# Patient Record
Sex: Female | Born: 1965 | Race: White | Hispanic: No | Marital: Married | Smoking: Never smoker
Health system: Southern US, Community
[De-identification: ages and names within clinical notes are randomized; demographics above are authoritative.]

## PROBLEM LIST (undated history)

## (undated) DIAGNOSIS — I10 Essential (primary) hypertension: Secondary | ICD-10-CM

## (undated) DIAGNOSIS — D219 Benign neoplasm of connective and other soft tissue, unspecified: Secondary | ICD-10-CM

## (undated) DIAGNOSIS — K862 Cyst of pancreas: Secondary | ICD-10-CM

## (undated) DIAGNOSIS — K219 Gastro-esophageal reflux disease without esophagitis: Secondary | ICD-10-CM

## (undated) DIAGNOSIS — R748 Abnormal levels of other serum enzymes: Secondary | ICD-10-CM

## (undated) DIAGNOSIS — K8020 Calculus of gallbladder without cholecystitis without obstruction: Secondary | ICD-10-CM

## (undated) DIAGNOSIS — N83209 Unspecified ovarian cyst, unspecified side: Secondary | ICD-10-CM

## (undated) HISTORY — DX: Essential (primary) hypertension: I10

## (undated) HISTORY — DX: Unspecified ovarian cyst, unspecified side: N83.209

## (undated) HISTORY — DX: Calculus of gallbladder without cholecystitis without obstruction: K80.20

## (undated) HISTORY — PX: CHOLECYSTECTOMY: SHX55

## (undated) HISTORY — PX: ABDOMINAL HYSTERECTOMY: SHX81

## (undated) HISTORY — PX: REFRACTIVE SURGERY: SHX103

## (undated) HISTORY — PX: APPENDECTOMY: SHX54

## (undated) HISTORY — PX: DILATION AND CURETTAGE OF UTERUS: SHX78

---

## 2009-07-31 ENCOUNTER — Ambulatory Visit: Payer: Self-pay | Admitting: Obstetrics and Gynecology

## 2010-09-04 ENCOUNTER — Ambulatory Visit: Payer: Self-pay | Admitting: Obstetrics and Gynecology

## 2010-09-05 ENCOUNTER — Ambulatory Visit: Payer: Self-pay | Admitting: Obstetrics and Gynecology

## 2011-09-30 ENCOUNTER — Ambulatory Visit: Payer: Self-pay | Admitting: Obstetrics and Gynecology

## 2012-08-13 ENCOUNTER — Ambulatory Visit: Payer: Self-pay | Admitting: Internal Medicine

## 2012-08-26 ENCOUNTER — Ambulatory Visit: Payer: Self-pay | Admitting: Otolaryngology

## 2012-10-05 ENCOUNTER — Ambulatory Visit: Payer: Self-pay | Admitting: Obstetrics and Gynecology

## 2012-10-06 ENCOUNTER — Ambulatory Visit: Payer: Self-pay | Admitting: Obstetrics and Gynecology

## 2013-05-17 DIAGNOSIS — D239 Other benign neoplasm of skin, unspecified: Secondary | ICD-10-CM

## 2013-05-17 HISTORY — DX: Other benign neoplasm of skin, unspecified: D23.9

## 2013-12-28 ENCOUNTER — Ambulatory Visit: Payer: Self-pay | Admitting: Physician Assistant

## 2014-03-25 ENCOUNTER — Ambulatory Visit (INDEPENDENT_AMBULATORY_CARE_PROVIDER_SITE_OTHER): Payer: BC Managed Care – PPO | Admitting: Physician Assistant

## 2014-03-25 VITALS — BP 152/90 | HR 89 | Temp 97.8°F | Resp 20 | Ht 64.0 in | Wt 187.4 lb

## 2014-03-25 DIAGNOSIS — N3001 Acute cystitis with hematuria: Secondary | ICD-10-CM

## 2014-03-25 DIAGNOSIS — R300 Dysuria: Secondary | ICD-10-CM

## 2014-03-25 DIAGNOSIS — N3000 Acute cystitis without hematuria: Secondary | ICD-10-CM

## 2014-03-25 DIAGNOSIS — R350 Frequency of micturition: Secondary | ICD-10-CM

## 2014-03-25 LAB — POCT UA - MICROSCOPIC ONLY
Bacteria, U Microscopic: NEGATIVE
Casts, Ur, LPF, POC: NEGATIVE
Crystals, Ur, HPF, POC: NEGATIVE
Epithelial cells, urine per micros: NEGATIVE
Mucus, UA: NEGATIVE
Yeast, UA: NEGATIVE

## 2014-03-25 LAB — POCT URINALYSIS DIPSTICK
Bilirubin, UA: NEGATIVE
Glucose, UA: NEGATIVE
Ketones, UA: NEGATIVE
Nitrite, UA: NEGATIVE
Protein, UA: 100
Spec Grav, UA: 1.02
Urobilinogen, UA: 0.2
pH, UA: 6

## 2014-03-25 MED ORDER — PHENAZOPYRIDINE HCL 200 MG PO TABS: 200.0000 mg | ORAL_TABLET | Freq: Three times a day (TID) | ORAL | Status: DC | PRN

## 2014-03-25 MED ORDER — CIPROFLOXACIN HCL 250 MG PO TABS: 250.0000 mg | ORAL_TABLET | Freq: Two times a day (BID) | ORAL | Status: DC

## 2014-03-25 NOTE — Progress Notes (Deleted)
   Subjective:    Patient ID: Denise [name], female    DOB: May 26, 1966, 48 y.o.   MRN: [id number]  HPI Dysuria and ugency since thurs.    Review of Systems     Objective:   Physical Exam        Assessment & Plan:

## 2014-03-25 NOTE — Progress Notes (Signed)
Patient ID: Denise [name] MRN: [id number], DOB: 02/04/1966, 48 y.o. Date of Encounter: 03/25/2014, 4:27 PM  Primary Physician: No PCP Per Patient  Chief Complaint: urinary frequency and dysuria  HPI: 48 y.o. year old female with presents with 3 day history of urinary frequency, dysuria, and suprapubic pressure. Denies flank pain, nausea, vomiting, fever, chills, vaginal discharge, or odor. Has tried to push fluids, no relief. LNMP 03/25/14. BP is elevated today - patient has PCP and Cardiologist. Reports her blood pressure "is always high." Patient is otherwise doing well without issues or complaints.  Past Medical History  Diagnosis Date  . Hypertension      Home Meds: Prior to Admission medications   Medication Sig Start Date End Date Taking? Authorizing Provider  cetirizine (ZYRTEC) 5 MG tablet Take 5 mg by mouth daily.   Yes Historical Provider, MD  Cholecalciferol 2000 UNITS TABS Take 1 tablet by mouth daily.   Yes Historical Provider, MD  ciprofloxacin (CIPRO) 250 MG tablet Take 1 tablet (250 mg total) by mouth 2 (two) times daily. 03/25/14   Lynsay Fesperman Elnora Morrison, PA-C  ibuprofen (ADVIL,MOTRIN) 200 MG tablet Take 800 mg by mouth daily.   Yes Historical Provider, MD  lisinopril (PRINIVIL,ZESTRIL) 20 MG tablet Take 20 mg by mouth daily.   Yes Historical Provider, MD  meloxicam (MOBIC) 7.5 MG tablet Take 7.5 mg by mouth daily.   Yes Historical Provider, MD  metoprolol succinate (TOPROL-XL) 25 MG 24 hr tablet Take 25 mg by mouth daily.   Yes Historical Provider, MD  Multiple Vitamins-Minerals (MULTIVITAMIN WITH MINERALS) tablet Take 1 tablet by mouth daily.   Yes Historical Provider, MD  Norethindrone-Ethinyl Estradiol-Fe (GENERESS FE) 0.8-25 MG-MCG tablet Chew 1 tablet by mouth daily.   Yes Historical Provider, MD  omeprazole (PRILOSEC) 40 MG capsule Take 40 mg by mouth daily.   Yes Historical Provider, MD  phenazopyridine (PYRIDIUM) 200 MG tablet Take 1 tablet (200 mg total) by mouth 3  (three) times daily as needed for pain. 03/25/14   Errica Dutil Elnora Morrison, PA-C  valACYclovir (VALTREX) 1000 MG tablet Take 1,000 mg by mouth 2 (two) times daily.   Yes Historical Provider, MD    Allergies:  Allergies  Allergen Reactions  . Sulfa Antibiotics Hives    History   Social History  . Marital Status: Married    Spouse Name: N/A    Number of Children: N/A  . Years of Education: N/A   Occupational History  . Not on file.   Social History Main Topics  . Smoking status: Never Smoker   . Smokeless tobacco: Never Used  . Alcohol Use: Yes     Comment: 1 drink/month  . Drug Use: No  . Sexual Activity: Not on file   Other Topics Concern  . Not on file   Social History Narrative  . No narrative on file     Review of Systems: Constitutional: negative for chills, fever, night sweats, weight changes, or fatigue  HEENT: negative for vision changes, hearing loss, congestion, rhinorrhea, ST, epistaxis, or sinus pressure Cardiovascular: negative for chest pain or palpitations Respiratory: negative for cough, hemoptysis, wheezing, shortness of breath. Abdominal: positive for suprapubic abdominal pain,   No nausea, vomiting, diarrhea, or constipation Genitourinary: positive for urinary frequency and dysuria. Negative for vaginal discharge, odor, or pelvic pain.  Dermatological: negative for rashes. Neurologic: negative for headache, dizziness, or syncope   Physical Exam: Blood pressure 152/90, pulse 89, temperature 97.8 F (36.6 C), temperature source Oral, resp.  rate 20, height 5\' 4"  (1.626 m), weight 187 lb 6 oz (84.993 kg), last menstrual period 03/25/2014, SpO2 98.00%., Body mass index is 32.15 kg/(m^2). General: Well developed, well nourished, in no acute distress. Head: Normocephalic, atraumatic, eyes without discharge, sclera non-icteric, nares are without discharge. External ear normal in appearance. Neck: Supple. No thyromegaly. Full ROM. No lymphadenopathy. Lungs: Clear  bilaterally to auscultation without wheezes, rales, or rhonchi. Breathing is unlabored. Heart: RRR with S1 S2. No murmurs, rubs, or gallops appreciated. Abdominal: +BS x 4. No hepatosplenomegaly, rebound tenderness, or guarding. Positive suprapubic tenderness. No CVA tenderness bilaterally.  Msk:  Strength and tone normal for age. Extremities/Skin: Warm and dry. No clubbing or cyanosis. No edema.  Neuro: Alert and oriented X 3. Moves all extremities spontaneously. Gait is normal. CNII-XII grossly in tact. Psych:  Responds to questions appropriately with a normal affect.   Labs: Results for orders placed in visit on 03/25/14  POCT URINALYSIS DIPSTICK      Result Value Ref Range   Color, UA yellow     Clarity, UA cloudy     Glucose, UA neg     Bilirubin, UA neg     Ketones, UA neg     Spec Grav, UA 1.020     Blood, UA large     pH, UA 6.0     Protein, UA 100     Urobilinogen, UA 0.2     Nitrite, UA neg     Leukocytes, UA moderate (2+)    POCT UA - MICROSCOPIC ONLY      Result Value Ref Range   WBC, Ur, HPF, POC tntc     RBC, urine, microscopic tntc     Bacteria, U Microscopic neg     Mucus, UA neg     Epithelial cells, urine per micros neg     Crystals, Ur, HPF, POC neg     Casts, Ur, LPF, POC neg     Yeast, UA neg        ASSESSMENT AND PLAN:  48 y.o. year old female with urinary tract infection Urine culture sent Increase fluids Cipro 250 mg bid x 5 days Pyridium 200 mg tid prn pain Follow up if symptoms worsen or fail to improve.  F/u with PCP as planned for elevated BP  Signed, Georgiann Mccoy, PA-C 03/25/2014 4:27 PM

## 2014-03-29 LAB — URINE CULTURE

## 2014-03-30 ENCOUNTER — Telehealth: Payer: Self-pay

## 2014-03-30 NOTE — Telephone Encounter (Signed)
Advised pt to RTC- she is not feeling 100% yet. She will try to come in tomorrow.

## 2014-03-30 NOTE — Telephone Encounter (Signed)
Advised pt to RTC.  She states she only got 3 days of the antibiotic she usually received at least 7 days for a UTI. Please advise if we can send additional days to her pharmacy.

## 2014-03-30 NOTE — Telephone Encounter (Signed)
Cipro is prescribed for 3 days. If she is feeling better, no further tx needed.

## 2014-03-30 NOTE — Telephone Encounter (Signed)
[facility] [place]   Needs more meds for UTI.   [phone]

## 2014-10-06 ENCOUNTER — Other Ambulatory Visit: Payer: Self-pay | Admitting: Obstetrics and Gynecology

## 2014-10-06 DIAGNOSIS — R928 Other abnormal and inconclusive findings on diagnostic imaging of breast: Secondary | ICD-10-CM

## 2014-10-16 ENCOUNTER — Ambulatory Visit
Admission: RE | Admit: 2014-10-16 | Discharge: 2014-10-16 | Disposition: A | Discharge status: Home / Self Care | Payer: BLUE CROSS/BLUE SHIELD | Source: Ambulatory Visit | Attending: Obstetrics and Gynecology | Admitting: Obstetrics and Gynecology

## 2014-10-16 ENCOUNTER — Other Ambulatory Visit: Payer: Self-pay | Admitting: Obstetrics and Gynecology

## 2014-10-16 DIAGNOSIS — R928 Other abnormal and inconclusive findings on diagnostic imaging of breast: Secondary | ICD-10-CM

## 2014-12-06 ENCOUNTER — Inpatient Hospital Stay (HOSPITAL_COMMUNITY)
Admission: AD | Admit: 2014-12-06 | Discharge: 2014-12-07 | Disposition: A | Discharge status: Home / Self Care | Payer: BLUE CROSS/BLUE SHIELD | Source: Ambulatory Visit | Attending: Obstetrics and Gynecology | Admitting: Obstetrics and Gynecology

## 2014-12-06 DIAGNOSIS — D259 Leiomyoma of uterus, unspecified: Secondary | ICD-10-CM | POA: Insufficient documentation

## 2014-12-06 DIAGNOSIS — N921 Excessive and frequent menstruation with irregular cycle: Secondary | ICD-10-CM

## 2014-12-06 DIAGNOSIS — I10 Essential (primary) hypertension: Secondary | ICD-10-CM | POA: Insufficient documentation

## 2014-12-06 DIAGNOSIS — N920 Excessive and frequent menstruation with regular cycle: Secondary | ICD-10-CM | POA: Insufficient documentation

## 2014-12-06 HISTORY — DX: Benign neoplasm of connective and other soft tissue, unspecified: D21.9

## 2014-12-06 NOTE — MAU Note (Signed)
Pt reports known fibroids, had Depo on 02/05. States she started spotting on 03/10 and spotted for 12 days, followed by a normal period that lasted until 04/14. States she then started having heavy bleeding and today she has been changing her pad q 15 min for the last 4 hours. Passing large clots.

## 2014-12-07 ENCOUNTER — Encounter (HOSPITAL_COMMUNITY): Payer: Self-pay

## 2014-12-07 DIAGNOSIS — I10 Essential (primary) hypertension: Secondary | ICD-10-CM | POA: Diagnosis not present

## 2014-12-07 DIAGNOSIS — N921 Excessive and frequent menstruation with irregular cycle: Secondary | ICD-10-CM

## 2014-12-07 DIAGNOSIS — D259 Leiomyoma of uterus, unspecified: Secondary | ICD-10-CM | POA: Diagnosis not present

## 2014-12-07 DIAGNOSIS — N920 Excessive and frequent menstruation with regular cycle: Secondary | ICD-10-CM | POA: Diagnosis not present

## 2014-12-07 LAB — CBC
HCT: 36.8 % (ref 36.0–46.0)
Hemoglobin: 11.9 g/dL — ABNORMAL LOW (ref 12.0–15.0)
MCH: 27.4 pg (ref 26.0–34.0)
MCHC: 32.3 g/dL (ref 30.0–36.0)
MCV: 84.8 fL (ref 78.0–100.0)
Platelets: 338 10*3/uL (ref 150–400)
RBC: 4.34 MIL/uL (ref 3.87–5.11)
RDW: 14.2 % (ref 11.5–15.5)
WBC: 13.9 10*3/uL — AB (ref 4.0–10.5)

## 2014-12-07 LAB — URINE MICROSCOPIC-ADD ON

## 2014-12-07 LAB — POCT PREGNANCY, URINE
PREG TEST UR: NEGATIVE
Preg Test, Ur: NEGATIVE

## 2014-12-07 LAB — URINALYSIS, ROUTINE W REFLEX MICROSCOPIC
Bilirubin Urine: NEGATIVE
GLUCOSE, UA: NEGATIVE mg/dL
Ketones, ur: NEGATIVE mg/dL
Leukocytes, UA: NEGATIVE
Nitrite: NEGATIVE
Protein, ur: NEGATIVE mg/dL
Specific Gravity, Urine: 1.025 (ref 1.005–1.030)
Urobilinogen, UA: 0.2 mg/dL (ref 0.0–1.0)
pH: 5.5 (ref 5.0–8.0)

## 2014-12-07 NOTE — MAU Provider Note (Signed)
History     CSN: [id number]  Arrival date and time: 12/06/14 2345   First Provider Initiated Contact with Patient 12/07/14 0038      Chief Complaint  Patient presents with  . Menorrhagia   HPI Comments: Denise [name] is a 49 y.o. [phone] who presents today with vaginal bleeding. She had depo on 2/5 because she had been bleeding for 45 days. She states that on March 10th she had 12 days of spotting until 3/22. Then on 3/22 she started to have menstrual like bleeding. She has been bleeding since then, but it was mostly light. However, in the last week it has gotten heavier, and she has been passing grape to apple sized clots today. She has been changing her pad every 15 mins. She called the office and, states that she was given an RX for megace QD x 7 days, and she has an appointment with Dr. Helane Rima on Tuesday.   Vaginal Bleeding The patient's primary symptoms include vaginal bleeding. The patient's pertinent negatives include no pelvic pain. This is a new problem. The current episode started more than 1 month ago. The problem has been waxing and waning. The pain is mild. She is not pregnant. Pertinent negatives include no abdominal pain, constipation, diarrhea (looser stools), dysuria, fever, frequency, nausea, urgency or vomiting. The vaginal bleeding is heavier than menses. She has been passing clots. She has not been passing tissue. Nothing aggravates the symptoms. Treatments tried: When she is laying down sleeping it has been less.  She is sexually active. No, her partner does not have an STD.   Past Medical History  Diagnosis Date  . Hypertension   . Fibroid     currently and previously    Past Surgical History  Procedure Laterality Date  . Appendectomy    . Cholecystectomy    . Cesarean section    . Eye surgery      Family History  Problem Relation Age of Onset  . Hyperlipidemia Mother   . Hypertension Mother   . Hyperlipidemia Brother   . Hypertension Brother      History  Substance Use Topics  . Smoking status: Never Smoker   . Smokeless tobacco: Never Used  . Alcohol Use: Yes     Comment: 1 drink/month    Allergies:  Allergies  Allergen Reactions  . Sulfa Antibiotics Hives    Prescriptions prior to admission  Medication Sig Dispense Refill Last Dose  . cetirizine (ZYRTEC) 5 MG tablet Take 5 mg by mouth daily.   12/06/2014 at Unknown time  . Cholecalciferol 2000 UNITS TABS Take 1 tablet by mouth daily.   12/06/2014 at Unknown time  . lisinopril (PRINIVIL,ZESTRIL) 20 MG tablet Take 20 mg by mouth daily.   12/06/2014 at Unknown time  . megestrol (MEGACE) 20 MG tablet Take 20 mg by mouth daily.   12/06/2014 at Unknown time  . metoprolol succinate (TOPROL-XL) 25 MG 24 hr tablet Take 25 mg by mouth daily.   12/06/2014 at Unknown time  . Multiple Vitamins-Minerals (MULTIVITAMIN WITH MINERALS) tablet Take 1 tablet by mouth daily.   12/06/2014 at Unknown time  . omeprazole (PRILOSEC) 40 MG capsule Take 40 mg by mouth daily.   12/06/2014 at Unknown time  . ciprofloxacin (CIPRO) 250 MG tablet Take 1 tablet (250 mg total) by mouth 2 (two) times daily. 6 tablet 0   . ibuprofen (ADVIL,MOTRIN) 200 MG tablet Take 800 mg by mouth daily.   Taking  . meloxicam (MOBIC) 7.5  MG tablet Take 7.5 mg by mouth daily.   Taking  . Norethindrone-Ethinyl Estradiol-Fe (GENERESS FE) 0.8-25 MG-MCG tablet Chew 1 tablet by mouth daily.   Taking  . phenazopyridine (PYRIDIUM) 200 MG tablet Take 1 tablet (200 mg total) by mouth 3 (three) times daily as needed for pain. 10 tablet 0   . valACYclovir (VALTREX) 1000 MG tablet Take 1,000 mg by mouth 2 (two) times daily.   Taking    Review of Systems  Constitutional: Negative for fever.       Swelling in fingers   Gastrointestinal: Negative for nausea, vomiting, abdominal pain, diarrhea (looser stools) and constipation.  Genitourinary: Positive for vaginal bleeding. Negative for dysuria, urgency, frequency and pelvic pain.    Physical Exam   Blood pressure 158/92, pulse 96, temperature 99.2 F (37.3 C), temperature source Oral, resp. rate 20, height 5' 4.5" (1.638 m), weight 89.812 kg (198 lb).  Physical Exam  Nursing note and vitals reviewed. Constitutional: She is oriented to person, place, and time. She appears well-developed and well-nourished. No distress.  Cardiovascular: Normal rate.   Respiratory: Effort normal.  GI: Soft. There is no tenderness. There is no rebound.  Genitourinary:   External: no lesion Vagina: small amount of blood seen  Cervix: pink, smooth, no CMT, small clot at the os.  Uterus: about 12 weeks size  Adnexa: NT   Neurological: She is alert and oriented to person, place, and time.  Skin: Skin is warm and dry.  Psychiatric: She has a normal mood and affect.   Results for orders placed or performed during the hospital encounter of 12/06/14 (from the past 24 hour(s))  CBC     Status: Abnormal   Collection Time: 12/07/14 12:36 AM  Result Value Ref Range   WBC 13.9 (H) 4.0 - 10.5 K/uL   RBC 4.34 3.87 - 5.11 MIL/uL   Hemoglobin 11.9 (L) 12.0 - 15.0 g/dL   HCT 36.8 36.0 - 46.0 %   MCV 84.8 78.0 - 100.0 fL   MCH 27.4 26.0 - 34.0 pg   MCHC 32.3 30.0 - 36.0 g/dL   RDW 14.2 11.5 - 15.5 %   Platelets 338 150 - 400 K/uL    MAU Course  Procedures  MDM 0140: D/W Dr. Matthew Saras. Ok for DC home at this time. She can call the office in the morning, and they will work her in for an appointment.   Assessment and Plan   1. Menometrorrhagia    DC home Continue to megace as currently prescribed FU with the office tomorrow  Return to MAU as needed  Follow-up Information    Follow up with GREWAL,MICHELLE L, MD In 1 day.   Specialty:  Obstetrics and Gynecology   Why:  Call the office in the morning. Let them know you were seen here overnight, and Dr. Matthew Saras wanted you to be seen in the office on 12/07/14   Contact information:   [place]  [phone]        Mathis Bud 12/07/2014, 12:39 AM

## 2014-12-07 NOTE — MAU Note (Signed)
POC urine pregnancy test done. Results negative, but not flowing from meter to the chart. Entered manually.

## 2014-12-07 NOTE — Discharge Instructions (Signed)

## 2015-01-16 ENCOUNTER — Encounter (HOSPITAL_COMMUNITY)
Admission: RE | Admit: 2015-01-16 | Discharge: 2015-01-16 | Disposition: A | Discharge status: Home / Self Care | Payer: BLUE CROSS/BLUE SHIELD | Source: Ambulatory Visit | Attending: Obstetrics and Gynecology | Admitting: Obstetrics and Gynecology

## 2015-01-16 ENCOUNTER — Encounter (HOSPITAL_COMMUNITY): Payer: Self-pay

## 2015-01-16 ENCOUNTER — Other Ambulatory Visit: Payer: Self-pay

## 2015-01-16 DIAGNOSIS — N920 Excessive and frequent menstruation with regular cycle: Secondary | ICD-10-CM | POA: Diagnosis not present

## 2015-01-16 DIAGNOSIS — Z01818 Encounter for other preprocedural examination: Secondary | ICD-10-CM | POA: Insufficient documentation

## 2015-01-16 HISTORY — DX: Gastro-esophageal reflux disease without esophagitis: K21.9

## 2015-01-16 LAB — CBC
HEMATOCRIT: 37.6 % (ref 36.0–46.0)
HEMOGLOBIN: 11.8 g/dL — AB (ref 12.0–15.0)
MCH: 25.8 pg — AB (ref 26.0–34.0)
MCHC: 31.4 g/dL (ref 30.0–36.0)
MCV: 82.1 fL (ref 78.0–100.0)
Platelets: 398 10*3/uL (ref 150–400)
RBC: 4.58 MIL/uL (ref 3.87–5.11)
RDW: 14.4 % (ref 11.5–15.5)
WBC: 10.8 10*3/uL — ABNORMAL HIGH (ref 4.0–10.5)

## 2015-01-16 NOTE — Patient Instructions (Signed)
Your procedure is scheduled on: June 16,2016   Enter through the Main Entrance of [facility] at: 12:00  Pick up the phone at the desk and dial [phone].  Call this number if you have problems the morning of surgery: [phone].  Remember: Do NOT eat food: after midnight on June 15,2016 Do NOT drink clear liquids after: 9:30 am day of surgery Take these medicines the morning of surgery with a SIP OF WATER:  Toprol-XL, prilosec, Lisinopril   Do NOT wear jewelry (body piercing), metal hair clips/bobby pins, make-up, or nail polish. Do NOT wear lotions, powders, or perfumes.  You may wear deoderant. Do NOT shave for 48 hours prior to surgery. Do NOT bring valuables to the hospital. Contacts, dentures, or bridgework may not be worn into surgery. Have a responsible adult drive you home and stay with you for 24 hours after your procedure

## 2015-01-30 NOTE — H&P (Signed)
49 year old G 3 P 1 with known uterine fibroids presents for HTA. She has large fibroids and one has a large submucosal component.  She has been having heavy bleeding despite OCPS and Depo Provera and Lysteda. She has been counseled on hysterectomy and wants to have a hysterectomy but today wants a HTA to decrease the bleeding because of her summer travel plans. She is aware that the HTA may not completely resolve her problem. She has also been informed that the fibroids are too large to resect with the hysteroscope.  Past Medical History  Diagnosis Date  . Hypertension   . Fibroid     currently and previously  . GERD (gastroesophageal reflux disease)     takes prilosec    Past Surgical History  Procedure Laterality Date  . Appendectomy    . Cholecystectomy    . Cesarean section    . Eye surgery    . Dilation and curettage of uterus      missed AB x2   . Family History  Problem Relation Age of Onset  . Hyperlipidemia Mother   . Hypertension Mother   . Hyperlipidemia Brother   . Hypertension Brother    History   Social History  . Marital Status: Married    Spouse Name: N/A  . Number of Children: N/A  . Years of Education: N/A   Occupational History  . Not on file.   Social History Main Topics  . Smoking status: Never Smoker   . Smokeless tobacco: Never Used  . Alcohol Use: Yes     Comment: 1 drink/month  . Drug Use: No  . Sexual Activity: Not on file   Other Topics Concern  . Not on file   Social History Narrative   Allergies:  Latex and Sulfa antibiotics  Prior to Admission medications   Medication Sig Start Date End Date Taking? Authorizing Provider  cetirizine (ZYRTEC) 10 MG tablet Take 10 mg by mouth daily.   Yes Historical Provider, MD  Cholecalciferol 2000 UNITS TABS Take 1 tablet by mouth daily.   Yes Historical Provider, MD  cyclobenzaprine (FLEXERIL) 10 MG tablet Take 10 mg by mouth 3 (three) times daily as needed for muscle spasms (cramps).   Yes  Historical Provider, MD  lisinopril (PRINIVIL,ZESTRIL) 20 MG tablet Take 20 mg by mouth daily.   Yes Historical Provider, MD  medroxyPROGESTERone (DEPO-PROVERA) 150 MG/ML injection Inject 150 mg into the muscle every 3 (three) months.   Yes Historical Provider, MD  megestrol (MEGACE) 20 MG tablet Take 20 mg by mouth daily.   Yes Historical Provider, MD  metoprolol succinate (TOPROL-XL) 25 MG 24 hr tablet Take 25 mg by mouth daily.   Yes Historical Provider, MD  Multiple Vitamins-Minerals (MULTIVITAMIN WITH MINERALS) tablet Take 1 tablet by mouth daily.   Yes Historical Provider, MD  naproxen sodium (ANAPROX) 220 MG tablet Take 220 mg by mouth daily as needed (headache).   Yes Historical Provider, MD  omeprazole (PRILOSEC) 40 MG capsule Take 40 mg by mouth daily.   Yes Historical Provider, MD  valACYclovir (VALTREX) 1000 MG tablet Take 1,000 mg by mouth 2 (two) times daily as needed (for fever blisters).   Yes Historical Provider, MD   There were no vitals taken for this visit. No results found for this or any previous visit (from the past 24 hour(s)). General alert and oriented Lung CTAB Car RRR Abdomen is soft and non tender Pelvic fibroid uterus  IMPRESSION: Menorrhagia Fibroids  PLAN:  HTA Risks reviewed Consent signed.

## 2015-02-01 ENCOUNTER — Encounter (HOSPITAL_COMMUNITY)
Admission: RE | Disposition: A | Discharge status: Home / Self Care | Payer: Self-pay | Source: Ambulatory Visit | Attending: Obstetrics and Gynecology

## 2015-02-01 ENCOUNTER — Ambulatory Visit (HOSPITAL_COMMUNITY): Payer: BLUE CROSS/BLUE SHIELD | Admitting: Anesthesiology

## 2015-02-01 ENCOUNTER — Ambulatory Visit (HOSPITAL_COMMUNITY)
Admission: RE | Admit: 2015-02-01 | Discharge: 2015-02-01 | Disposition: A | Discharge status: Home / Self Care | Payer: BLUE CROSS/BLUE SHIELD | Source: Ambulatory Visit | Attending: Obstetrics and Gynecology | Admitting: Obstetrics and Gynecology

## 2015-02-01 DIAGNOSIS — Z79899 Other long term (current) drug therapy: Secondary | ICD-10-CM | POA: Insufficient documentation

## 2015-02-01 DIAGNOSIS — D250 Submucous leiomyoma of uterus: Secondary | ICD-10-CM | POA: Diagnosis not present

## 2015-02-01 DIAGNOSIS — N938 Other specified abnormal uterine and vaginal bleeding: Secondary | ICD-10-CM | POA: Diagnosis present

## 2015-02-01 DIAGNOSIS — I10 Essential (primary) hypertension: Secondary | ICD-10-CM | POA: Diagnosis not present

## 2015-02-01 DIAGNOSIS — Z793 Long term (current) use of hormonal contraceptives: Secondary | ICD-10-CM | POA: Diagnosis not present

## 2015-02-01 DIAGNOSIS — K219 Gastro-esophageal reflux disease without esophagitis: Secondary | ICD-10-CM | POA: Diagnosis not present

## 2015-02-01 DIAGNOSIS — Z791 Long term (current) use of non-steroidal anti-inflammatories (NSAID): Secondary | ICD-10-CM | POA: Diagnosis not present

## 2015-02-01 HISTORY — PX: DILITATION & CURRETTAGE/HYSTROSCOPY WITH HYDROTHERMAL ABLATION: SHX5570

## 2015-02-01 LAB — BASIC METABOLIC PANEL
Anion gap: 6 (ref 5–15)
BUN: 12 mg/dL (ref 6–20)
CALCIUM: 9.2 mg/dL (ref 8.9–10.3)
CO2: 25 mmol/L (ref 22–32)
CREATININE: 0.74 mg/dL (ref 0.44–1.00)
Chloride: 108 mmol/L (ref 101–111)
GFR calc Af Amer: >60 mL/min (ref >60)
GFR calc non Af Amer: >60 mL/min (ref >60)
Glucose, Bld: 98 mg/dL (ref 65–99)
Potassium: 4.3 mmol/L (ref 3.5–5.1)
Sodium: 139 mmol/L (ref 135–145)

## 2015-02-01 LAB — TYPE AND SCREEN
ABO/RH(D): AB NEG
ANTIBODY SCREEN: NEGATIVE

## 2015-02-01 LAB — ABO/RH: ABO/RH(D): AB NEG

## 2015-02-01 LAB — PREGNANCY, URINE: PREG TEST UR: NEGATIVE

## 2015-02-01 SURGERY — DILATATION & CURETTAGE/HYSTEROSCOPY WITH HYDROTHERMAL ABLATION
Anesthesia: General

## 2015-02-01 MED ORDER — CEFAZOLIN SODIUM-DEXTROSE 2-3 GM-% IV SOLR
INTRAVENOUS | Status: DC
Start: 2015-02-01 — End: 2015-02-01
  Filled 2015-02-01: qty 50

## 2015-02-01 MED ORDER — LIDOCAINE HCL (CARDIAC) 20 MG/ML IV SOLN
INTRAVENOUS | Status: DC | PRN
  Administered 2015-02-01: 60 mg via INTRAVENOUS

## 2015-02-01 MED ORDER — MIDAZOLAM HCL 2 MG/2ML IJ SOLN
INTRAMUSCULAR | Status: AC
  Filled 2015-02-01: qty 2

## 2015-02-01 MED ORDER — LACTATED RINGERS IV SOLN: INTRAVENOUS | Status: DC

## 2015-02-01 MED ORDER — PROPOFOL 10 MG/ML IV BOLUS
INTRAVENOUS | Status: AC
  Filled 2015-02-01: qty 20

## 2015-02-01 MED ORDER — DEXAMETHASONE SODIUM PHOSPHATE 10 MG/ML IJ SOLN
INTRAMUSCULAR | Status: DC | PRN
  Administered 2015-02-01: 4 mg via INTRAVENOUS

## 2015-02-01 MED ORDER — ACETAMINOPHEN 160 MG/5ML PO SOLN
ORAL | Status: AC
  Administered 2015-02-01: 975 mg via ORAL
  Filled 2015-02-01: qty 40.6

## 2015-02-01 MED ORDER — OXYCODONE-ACETAMINOPHEN 5-325 MG PO TABS
1.0000 | ORAL_TABLET | ORAL | Status: DC | PRN
  Administered 2015-02-01: 1 via ORAL

## 2015-02-01 MED ORDER — KETOROLAC TROMETHAMINE 30 MG/ML IJ SOLN
INTRAMUSCULAR | Status: DC | PRN
  Administered 2015-02-01: 30 mg via INTRAVENOUS

## 2015-02-01 MED ORDER — MIDAZOLAM HCL 2 MG/2ML IJ SOLN
INTRAMUSCULAR | Status: DC | PRN
  Administered 2015-02-01: 2 mg via INTRAVENOUS

## 2015-02-01 MED ORDER — FENTANYL CITRATE (PF) 100 MCG/2ML IJ SOLN
INTRAMUSCULAR | Status: AC
  Filled 2015-02-01: qty 2

## 2015-02-01 MED ORDER — LIDOCAINE HCL 1 % IJ SOLN
INTRAMUSCULAR | Status: DC | PRN
  Administered 2015-02-01: 10 mL

## 2015-02-01 MED ORDER — CEFAZOLIN SODIUM-DEXTROSE 2-3 GM-% IV SOLR
2.0000 g | INTRAVENOUS | Status: AC
  Administered 2015-02-01: 2 g via INTRAVENOUS

## 2015-02-01 MED ORDER — PHENYLEPHRINE HCL 10 MG/ML IJ SOLN
INTRAMUSCULAR | Status: DC | PRN
  Administered 2015-02-01: 80 ug via INTRAVENOUS

## 2015-02-01 MED ORDER — OXYCODONE-ACETAMINOPHEN 10-325 MG PO TABS: 1.0000 | ORAL_TABLET | ORAL | Status: DC | PRN

## 2015-02-01 MED ORDER — DEXAMETHASONE SODIUM PHOSPHATE 4 MG/ML IJ SOLN
INTRAMUSCULAR | Status: AC
  Filled 2015-02-01: qty 1

## 2015-02-01 MED ORDER — FENTANYL CITRATE (PF) 100 MCG/2ML IJ SOLN
25.0000 ug | INTRAMUSCULAR | Status: DC | PRN
  Administered 2015-02-01: 50 ug via INTRAVENOUS

## 2015-02-01 MED ORDER — SCOPOLAMINE 1 MG/3DAYS TD PT72
1.0000 | MEDICATED_PATCH | Freq: Once | TRANSDERMAL | Status: DC
  Administered 2015-02-01: 1.5 mg via TRANSDERMAL

## 2015-02-01 MED ORDER — FENTANYL CITRATE (PF) 100 MCG/2ML IJ SOLN
INTRAMUSCULAR | Status: DC | PRN
  Administered 2015-02-01 (×4): 50 ug via INTRAVENOUS

## 2015-02-01 MED ORDER — SCOPOLAMINE 1 MG/3DAYS TD PT72
MEDICATED_PATCH | TRANSDERMAL | Status: DC
Start: 2015-02-01 — End: 2015-02-01
  Administered 2015-02-01: 1.5 mg via TRANSDERMAL
  Filled 2015-02-01: qty 1

## 2015-02-01 MED ORDER — KETOROLAC TROMETHAMINE 30 MG/ML IJ SOLN
INTRAMUSCULAR | Status: AC
  Filled 2015-02-01: qty 1

## 2015-02-01 MED ORDER — ONDANSETRON HCL 4 MG/2ML IJ SOLN
INTRAMUSCULAR | Status: DC | PRN
  Administered 2015-02-01: 4 mg via INTRAVENOUS

## 2015-02-01 MED ORDER — PROPOFOL 10 MG/ML IV BOLUS
INTRAVENOUS | Status: DC | PRN
  Administered 2015-02-01: 200 mg via INTRAVENOUS

## 2015-02-01 MED ORDER — LIDOCAINE HCL 1 % IJ SOLN
INTRAMUSCULAR | Status: AC
  Filled 2015-02-01: qty 20

## 2015-02-01 MED ORDER — ONDANSETRON HCL 4 MG/2ML IJ SOLN
INTRAMUSCULAR | Status: AC
  Filled 2015-02-01: qty 2

## 2015-02-01 MED ORDER — LIDOCAINE HCL (CARDIAC) 20 MG/ML IV SOLN
INTRAVENOUS | Status: AC
  Filled 2015-02-01: qty 5

## 2015-02-01 MED ORDER — OXYCODONE-ACETAMINOPHEN 5-325 MG PO TABS
ORAL_TABLET | ORAL | Status: AC
  Filled 2015-02-01: qty 1

## 2015-02-01 MED ORDER — ACETAMINOPHEN 160 MG/5ML PO SOLN
975.0000 mg | Freq: Once | ORAL | Status: AC
  Administered 2015-02-01: 975 mg via ORAL

## 2015-02-01 MED ORDER — LACTATED RINGERS IV SOLN
INTRAVENOUS | Status: DC
  Administered 2015-02-01: 13:00:00 via INTRAVENOUS

## 2015-02-01 SURGICAL SUPPLY — 16 items
CANISTER SUCT 3000ML (MISCELLANEOUS) ×2 IMPLANT
CATH ROBINSON RED A/P 16FR (CATHETERS) ×2 IMPLANT
CLOTH BEACON ORANGE TIMEOUT ST (SAFETY) ×2 IMPLANT
CONTAINER PREFILL 10% NBF 60ML (FORM) ×4 IMPLANT
ELECT REM PT RETURN 9FT ADLT (ELECTROSURGICAL) ×2
ELECTRODE REM PT RTRN 9FT ADLT (ELECTROSURGICAL) ×1 IMPLANT
GLOVE BIO SURGEON STRL SZ 6.5 (GLOVE) ×2 IMPLANT
GOWN STRL REUS W/TWL LRG LVL3 (GOWN DISPOSABLE) ×4 IMPLANT
LOOP ANGLED CUTTING 22FR (CUTTING LOOP) IMPLANT
PACK VAGINAL MINOR WOMEN LF (CUSTOM PROCEDURE TRAY) ×2 IMPLANT
PAD OB MATERNITY 4.3X12.25 (PERSONAL CARE ITEMS) ×2 IMPLANT
SCOPETTES 8  STERILE (MISCELLANEOUS)
SCOPETTES 8 STERILE (MISCELLANEOUS) IMPLANT
SET GENESYS HTA PROCERVA (MISCELLANEOUS) ×2 IMPLANT
TOWEL OR 17X24 6PK STRL BLUE (TOWEL DISPOSABLE) ×4 IMPLANT
WATER STERILE IRR 1000ML POUR (IV SOLUTION) ×2 IMPLANT

## 2015-02-01 NOTE — Anesthesia Preprocedure Evaluation (Signed)
Anesthesia Evaluation  Patient identified by MRN, date of birth, ID band Patient awake    Reviewed: Allergy & Precautions, H&P , Patient's Chart, lab work & pertinent test results, reviewed documented beta blocker date and time   Airway Mallampati: II  TM Distance: >3 FB Neck ROM: full    Dental no notable dental hx.    Pulmonary  breath sounds clear to auscultation  Pulmonary exam normal       Cardiovascular hypertension, On Medications Rhythm:regular Rate:Normal     Neuro/Psych    GI/Hepatic GERD-  Medicated,  Endo/Other    Renal/GU      Musculoskeletal   Abdominal   Peds  Hematology   Anesthesia Other Findings Hypertension Fibroid   GERD (gastroesophageal reflux         Reproductive/Obstetrics                             Anesthesia Physical Anesthesia Plan  ASA: II  Anesthesia Plan:    Post-op Pain Management:    Induction: Intravenous  Airway Management Planned: LMA  Additional Equipment:   Intra-op Plan:   Post-operative Plan:   Informed Consent: I have reviewed the patients History and Physical, chart, labs and discussed the procedure including the risks, benefits and alternatives for the proposed anesthesia with the patient or authorized representative who has indicated his/her understanding and acceptance.   Dental Advisory Given and Dental advisory given  Plan Discussed with: CRNA and Surgeon  Anesthesia Plan Comments: (Discussed GA with LMA, possible sore throat, potential need to switch to ETT, N/V, pulmonary aspiration. Questions answered. )        Anesthesia Quick Evaluation

## 2015-02-01 NOTE — Discharge Instructions (Signed)
DISCHARGE INSTRUCTIONS: D&C The following instructions have been prepared to help you care for yourself upon your return home.  REMOVE THE PATCH BEHIND YOUR EAR ON 02/03/15.  [place] YOUR HANDS THOROUGHLY AFTER REMOVAL!!! MAY TAKE IBUPROFEN (MOTRIN, ADVIL) OR ALEVE AFTER 8:00 PM!!!   Personal hygiene:  Use sanitary pads for vaginal drainage, not tampons.  Shower the day after your procedure.  NO tub baths, pools or Jacuzzis for 2-3 weeks.  Wipe front to back after using the bathroom.  Activity and limitations:  Do NOT drive or operate any equipment for 24 hours. The effects of anesthesia are still present and drowsiness may result.  Do NOT rest in bed all day.  Walking is encouraged.  Walk up and down stairs slowly.  You may resume your normal activity in one to two days or as indicated by your physician.  Sexual activity: NO intercourse for at least 2 weeks after the procedure, or as indicated by your physician.  Diet: Eat a light meal as desired this evening. You may resume your usual diet tomorrow.  Return to work: You may resume your work activities in one to two days or as indicated by your doctor.  What to expect after your surgery: Expect to have vaginal bleeding/discharge for 2-3 days and spotting for up to 10 days. It is not unusual to have soreness for up to 1-2 weeks. You may have a slight burning sensation when you urinate for the first day. Mild cramps may continue for a couple of days. You may have a regular period in 2-6 weeks.  Call your doctor for any of the following:  Excessive vaginal bleeding, saturating and changing one pad every hour.  Inability to urinate 6 hours after discharge from hospital.  Pain not relieved by pain medication.  Fever of 100.4 F or greater.  Unusual vaginal discharge or odor.   Call for an appointment:    Patients signature: ______________________  Nurses signature ________________________  Support person's  signature_______________________

## 2015-02-01 NOTE — Progress Notes (Signed)
H and P on the chart No significant changes Will proceed with D and C, HYS, HTA Consent signed

## 2015-02-01 NOTE — Brief Op Note (Signed)
02/01/2015  1:58 PM  PATIENT:  Denise [name]  48 y.o. female  PRE-OPERATIVE DIAGNOSIS:  MENORRHAGIA  POST-OPERATIVE DIAGNOSIS:  MENORRHAGIA, Fibroids  PROCEDURE:  Procedure(s): HYSTEROSCOPY WITH HYDROTHERMAL ABLATION (N/A)  SURGEON:  Surgeon(s) and Role:    * Dian Queen, MD - Primary  PHYSICIAN ASSISTANT:   ASSISTANTS: none   ANESTHESIA:   paracervical block and MAC  EBL:  Total I/O In: -  Out: 50 [Urine:50]  BLOOD ADMINISTERED:none  DRAINS: none   LOCAL MEDICATIONS USED:  LIDOCAINE   SPECIMEN:  No Specimen  DISPOSITION OF SPECIMEN:  N/A  COUNTS:  YES  TOURNIQUET:  * No tourniquets in log *  DICTATION: .Other Dictation: Dictation Number [id number]  PLAN OF CARE: discharge home  PATIENT DISPOSITION:  PACU - hemodynamically stable.   Delay start of Pharmacological VTE agent (>24hrs) due to surgical blood loss or risk of bleeding: yes

## 2015-02-01 NOTE — Transfer of Care (Signed)
Immediate Anesthesia Transfer of Care Note  Patient: Denise [name]  Procedure(s) Performed: Procedure(s): HYSTEROSCOPY WITH HYDROTHERMAL ABLATION (N/A)  Patient Location: PACU  Anesthesia Type:General  Level of Consciousness: awake, alert  and oriented  Airway & Oxygen Therapy: Patient Spontanous Breathing and Patient connected to nasal cannula oxygen  Post-op Assessment: Report given to RN and Post -op Vital signs reviewed and stable  Post vital signs: Reviewed and stable  Last Vitals:  Filed Vitals:   02/01/15 1204  BP: 133/89  Pulse: 98  Temp: 36.7 C  Resp: 18    Complications: No apparent anesthesia complications

## 2015-02-01 NOTE — Anesthesia Procedure Notes (Signed)
Procedure Name: LMA Insertion Date/Time: 02/01/2015 1:17 PM Performed by: Jonna Munro Pre-anesthesia Checklist: Patient identified, Emergency Drugs available, Suction available, Timeout performed and Patient being monitored Patient Re-evaluated:Patient Re-evaluated prior to inductionOxygen Delivery Method: Circle system utilized Preoxygenation: Pre-oxygenation with 100% oxygen Intubation Type: IV induction LMA: LMA inserted LMA Size: 4.0 Number of attempts: 1 Placement Confirmation: positive ETCO2 and breath sounds checked- equal and bilateral Tube secured with: Tape Dental Injury: Teeth and Oropharynx as per pre-operative assessment

## 2015-02-02 NOTE — Op Note (Signed)
[name], [name]                ACCOUNT NO.:  [id number]  MEDICAL RECORD NO.:  [id number]  LOCATION:  [facility]                          FACILITY:  [facility]  PHYSICIAN:  Blessing Ozga L. Gianni Fuchs, M.D.DATE OF BIRTH:  04-Jan-1966  DATE OF PROCEDURE:  02/01/2015 DATE OF DISCHARGE:  02/01/2015                              OPERATIVE REPORT   PREOPERATIVE DIAGNOSES: 1. Dysfunctional bleeding. 2. Menometrorrhagia. 3. Fibroids.  POSTOPERATIVE DIAGNOSES: 1. Dysfunctional bleeding. 2. Menometrorrhagia. 3. Fibroids.  PROCEDURE:  Hysteroscopy with hydrothermablation.  SURGEON:  Keonia Pasko L. Helane Rima, M.D.  ANESTHESIA:  LMA with paracervical block.  ESTIMATED BLOOD LOSS:  Minimal.  COMPLICATIONS:  None.  DRAINS:  None.  PATHOLOGY:  None.  DESCRIPTION OF PROCEDURE:  The patient was taken to the operating room and after consent had been obtained, she was then prepped and draped in usual sterile fashion.  In-and-out was catheter used to empty the bladder.  The cervix was grasped with a tenaculum.  Paracervical block was performed in standard fashion.  The cervical internal os was dilated gently with Kennon Rounds dilators and the hysteroscope with the HTA that had been appropriately primed was inserted easily.  Once we entered the uterus, I could see that she had a very large submucosal component of a fibroid that was much too large to resect by the hysteroscope.  The patient's mean arterial pressure was 72, so we elevated the patient's bed, we performed a fluid check and the fluid check was adequate.  We then did an HTA for the appropriate 10-minute cycle and then at the end of the procedure, the hysteroscope was removed.  There was no bleeding or drainage at the end of the procedure.  All sponge, lap, and instrument counts were correct x2.  The patient went to recovery room stable in stable condition.     Kevonna Nolte L. Helane Rima, M.D.     [name]  D:  02/01/2015  T:  02/02/2015  Job:  [id number]

## 2015-02-03 ENCOUNTER — Encounter (HOSPITAL_COMMUNITY): Payer: Self-pay | Admitting: Obstetrics and Gynecology

## 2015-02-05 NOTE — Anesthesia Postprocedure Evaluation (Signed)
  Anesthesia Post-op Note  Patient: Denise [name]  Procedure(s) Performed: Procedure(s): HYSTEROSCOPY WITH HYDROTHERMAL ABLATION (N/A) Patient is awake and responsive. Pain and nausea are reasonably well controlled. Vital signs are stable and clinically acceptable. Oxygen saturation is clinically acceptable. There are no apparent anesthetic complications at this time. Patient is ready for discharge.

## 2015-06-29 ENCOUNTER — Encounter: Payer: Self-pay | Admitting: Emergency Medicine

## 2015-06-29 ENCOUNTER — Emergency Department: Payer: BLUE CROSS/BLUE SHIELD

## 2015-06-29 ENCOUNTER — Emergency Department
Admission: EM | Admit: 2015-06-29 | Discharge: 2015-06-29 | Disposition: A | Discharge status: Home / Self Care | Payer: BLUE CROSS/BLUE SHIELD | Attending: Student | Admitting: Student

## 2015-06-29 DIAGNOSIS — Z79899 Other long term (current) drug therapy: Secondary | ICD-10-CM | POA: Insufficient documentation

## 2015-06-29 DIAGNOSIS — K5900 Constipation, unspecified: Secondary | ICD-10-CM | POA: Diagnosis not present

## 2015-06-29 DIAGNOSIS — Z91040 Latex allergy status: Secondary | ICD-10-CM | POA: Diagnosis not present

## 2015-06-29 DIAGNOSIS — K859 Acute pancreatitis without necrosis or infection, unspecified: Secondary | ICD-10-CM | POA: Insufficient documentation

## 2015-06-29 DIAGNOSIS — M62830 Muscle spasm of back: Secondary | ICD-10-CM | POA: Diagnosis not present

## 2015-06-29 DIAGNOSIS — I10 Essential (primary) hypertension: Secondary | ICD-10-CM | POA: Insufficient documentation

## 2015-06-29 DIAGNOSIS — M549 Dorsalgia, unspecified: Secondary | ICD-10-CM | POA: Diagnosis present

## 2015-06-29 LAB — COMPREHENSIVE METABOLIC PANEL
ALT: 20 U/L (ref 14–54)
ANION GAP: 3 — AB (ref 5–15)
AST: 21 U/L (ref 15–41)
Albumin: 3.8 g/dL (ref 3.5–5.0)
Alkaline Phosphatase: 134 U/L — ABNORMAL HIGH (ref 38–126)
BUN: 14 mg/dL (ref 6–20)
CALCIUM: 8.9 mg/dL (ref 8.9–10.3)
CHLORIDE: 106 mmol/L (ref 101–111)
CO2: 27 mmol/L (ref 22–32)
Creatinine, Ser: 0.69 mg/dL (ref 0.44–1.00)
GFR calc non Af Amer: >60 mL/min (ref >60)
Glucose, Bld: 97 mg/dL (ref 65–99)
Potassium: 4.1 mmol/L (ref 3.5–5.1)
SODIUM: 136 mmol/L (ref 135–145)
Total Bilirubin: 0.2 mg/dL — ABNORMAL LOW (ref 0.3–1.2)
Total Protein: 7.4 g/dL (ref 6.5–8.1)

## 2015-06-29 LAB — CBC WITH DIFFERENTIAL/PLATELET
Basophils Absolute: 0.1 10*3/uL (ref 0–0.1)
Basophils Relative: 1 %
EOS ABS: 0.2 10*3/uL (ref 0–0.7)
EOS PCT: 2 %
HCT: 38.4 % (ref 35.0–47.0)
Hemoglobin: 12.2 g/dL (ref 12.0–16.0)
Lymphocytes Relative: 24 %
Lymphs Abs: 2.9 10*3/uL (ref 1.0–3.6)
MCH: 25.6 pg — AB (ref 26.0–34.0)
MCHC: 31.9 g/dL — AB (ref 32.0–36.0)
MCV: 80.3 fL (ref 80.0–100.0)
MONOS PCT: 8 %
Monocytes Absolute: 0.9 10*3/uL (ref 0.2–0.9)
Neutro Abs: 8 10*3/uL — ABNORMAL HIGH (ref 1.4–6.5)
Neutrophils Relative %: 65 %
PLATELETS: 335 10*3/uL (ref 150–440)
RBC: 4.78 MIL/uL (ref 3.80–5.20)
RDW: 15.3 % — ABNORMAL HIGH (ref 11.5–14.5)
WBC: 12 10*3/uL — AB (ref 3.6–11.0)

## 2015-06-29 LAB — APTT: APTT: 27 s (ref 24–36)

## 2015-06-29 LAB — LIPASE, BLOOD: Lipase: 68 U/L — ABNORMAL HIGH (ref 11–51)

## 2015-06-29 LAB — FIBRIN DERIVATIVES D-DIMER (ARMC ONLY): Fibrin derivatives D-dimer (ARMC): 546 — ABNORMAL HIGH (ref 0–499)

## 2015-06-29 LAB — PROTIME-INR
INR: 1
PROTHROMBIN TIME: 13.4 s (ref 11.4–15.0)

## 2015-06-29 LAB — TROPONIN I

## 2015-06-29 MED ORDER — ONDANSETRON 4 MG PO TBDP: 4.0000 mg | ORAL_TABLET | Freq: Three times a day (TID) | ORAL | Status: DC | PRN

## 2015-06-29 MED ORDER — SODIUM CHLORIDE 0.9 % IV BOLUS (SEPSIS)
500.0000 mL | Freq: Once | INTRAVENOUS | Status: AC
  Administered 2015-06-29: 500 mL via INTRAVENOUS

## 2015-06-29 MED ORDER — IOHEXOL 350 MG/ML SOLN
100.0000 mL | Freq: Once | INTRAVENOUS | Status: AC | PRN
  Administered 2015-06-29: 100 mL via INTRAVENOUS

## 2015-06-29 MED ORDER — DIAZEPAM 5 MG PO TABS
5.0000 mg | ORAL_TABLET | Freq: Once | ORAL | Status: AC
  Administered 2015-06-29: 5 mg via ORAL
  Filled 2015-06-29: qty 1

## 2015-06-29 NOTE — ED Notes (Signed)
Patient transported to CT 

## 2015-06-29 NOTE — ED Provider Notes (Signed)
[facility] Emergency Department Provider Note  ____________________________________________  Time seen: Approximately 12:17 PM  I have reviewed the triage vital signs and the nursing notes.   HISTORY  Chief Complaint Back Pain    HPI Denise [name] is a 49 y.o. female with hypertension, GERD, status post hysterectomy approximately 3 weeks ago who presents for evaluation of 5 days gradual onset left upper thoracic back pain, she describes as spasms, intermittent. She reports that the pain sometimes takes her breath away but denies any chest pain or difficulty breathing. She denies any radiation of the pain to her chest. She denies any bowel or bladder incontinence, numbness or weakness, history of cancer or IV drug use. Rarely her symptoms are out to moderate. She told her surgeon about this who sent her to the emergency department as she was concerned the patient might have a pulmonary embolism. She has had some mild constipation however had a large bowel movement 2 days ago without much better. No vomiting though she does feel nauseated when her pain is at its maximum. No abdominal pain. No pain or burning with urination. Her incision is healing well.   Past Medical History  Diagnosis Date  . Hypertension   . Fibroid     currently and previously  . GERD (gastroesophageal reflux disease)     takes prilosec     There are no active problems to display for this patient.   Past Surgical History  Procedure Laterality Date  . Appendectomy    . Cholecystectomy    . Cesarean section    . Eye surgery    . Dilation and curettage of uterus      missed AB x2   . Dilitation & currettage/hystroscopy with hydrothermal ablation N/A 02/01/2015    Procedure: HYSTEROSCOPY WITH HYDROTHERMAL ABLATION;  Surgeon: Dian Queen, MD;  Location: [facility];  Service: Gynecology;  Laterality: N/A;  . Abdominal hysterectomy      Current Outpatient Rx  Name  Route  Sig  Dispense   Refill  . cetirizine (ZYRTEC) 10 MG tablet   Oral   Take 10 mg by mouth daily.         . Cholecalciferol 2000 UNITS TABS   Oral   Take 1 tablet by mouth daily.         . cyclobenzaprine (FLEXERIL) 10 MG tablet   Oral   Take 10 mg by mouth 3 (three) times daily as needed for muscle spasms (cramps).         Marland Kitchen lisinopril (PRINIVIL,ZESTRIL) 20 MG tablet   Oral   Take 20 mg by mouth daily.         . metoprolol succinate (TOPROL-XL) 25 MG 24 hr tablet   Oral   Take 25 mg by mouth daily.         . Multiple Vitamins-Minerals (MULTIVITAMIN WITH MINERALS) tablet   Oral   Take 1 tablet by mouth daily.         . naproxen sodium (ANAPROX) 220 MG tablet   Oral   Take 220 mg by mouth daily as needed (headache).         Marland Kitchen omeprazole (PRILOSEC) 40 MG capsule   Oral   Take 40 mg by mouth daily.         Marland Kitchen oxyCODONE-acetaminophen (PERCOCET) 10-325 MG per tablet   Oral   Take 1 tablet by mouth every 4 (four) hours as needed for pain.   30 tablet   0   .  valACYclovir (VALTREX) 1000 MG tablet   Oral   Take 1,000 mg by mouth 2 (two) times daily as needed (for fever blisters).           Allergies Latex and Sulfa antibiotics  Family History  Problem Relation Age of Onset  . Hyperlipidemia Mother   . Hypertension Mother   . Hyperlipidemia Brother   . Hypertension Brother     Social History Social History  Substance Use Topics  . Smoking status: Never Smoker   . Smokeless tobacco: Never Used  . Alcohol Use: Yes     Comment: 1 drink/month    Review of Systems Constitutional: No fever/chills Eyes: No visual changes. ENT: No sore throat. Cardiovascular: Denies chest pain. Respiratory: Denies shortness of breath. Gastrointestinal: No abdominal pain.  No nausea, no vomiting.  No diarrhea.  No constipation. Genitourinary: Negative for dysuria. Musculoskeletal: +Left upper thoracic back pain Skin: Negative for rash. Neurological: Negative for headaches, focal  weakness or numbness.  10-point ROS otherwise negative.  ____________________________________________   PHYSICAL EXAM:  VITAL SIGNS: ED Triage Vitals  Enc Vitals Group     BP 06/29/15 1208 161/103 mmHg     Pulse Rate 06/29/15 1205 88     Resp 06/29/15 1205 16     Temp 06/29/15 1205 98.2 F (36.8 C)     Temp Source 06/29/15 1205 Oral     SpO2 06/29/15 1205 99 %     Weight 06/29/15 1205 198 lb (89.812 kg)     Height 06/29/15 1205 5\' 4"  (1.626 m)     Head Cir --      Peak Flow --      Pain Score 06/29/15 1207 6     Pain Loc --      Pain Edu? --      Excl. in [place]? --     Constitutional: Alert and oriented. Well appearing and in no acute distress. Eyes: Conjunctivae are normal. PERRL. EOMI. Head: Atraumatic. Nose: No congestion/rhinnorhea. Mouth/Throat: Mucous membranes are moist.  Oropharynx non-erythematous. Neck: No stridor.  Cardiovascular: Normal rate, regular rhythm. Grossly normal heart sounds.  Good peripheral circulation. Respiratory: Normal respiratory effort.  No retractions. Lungs CTAB. Gastrointestinal: Soft and nontender. No distention. Incision in the lower abdomen is healing well without dehiscence, draining, erythema, or surrounding tenderness Genitourinary: deferred Musculoskeletal: No lower extremity tenderness nor edema.  No calf tenderness or swelling or asymmetry. There is moderate tenderness to palpation in the paravertebral muscles of the left thoracic spine. No midline T or L-spine tenderness to palpation. Neurologic:  Normal speech and language. No gross focal neurologic deficits are appreciated. 5 out of 5 strength in bilateral upper and lower extremities. Sensation intact to light touch throughout. Normal ambulation. Skin:  Skin is warm, dry and intact. No rash noted. Psychiatric: Mood and affect are normal. Speech and behavior are normal.  ____________________________________________   LABS (all labs ordered are listed, but only abnormal results  are displayed)  Labs Reviewed  CBC WITH DIFFERENTIAL/PLATELET - Abnormal; Notable for the following:    WBC 12.0 (*)    MCH 25.6 (*)    MCHC 31.9 (*)    RDW 15.3 (*)    Neutro Abs 8.0 (*)    All other components within normal limits  COMPREHENSIVE METABOLIC PANEL - Abnormal; Notable for the following:    Alkaline Phosphatase 134 (*)    Total Bilirubin 0.2 (*)    Anion gap 3 (*)    All other components within normal limits  LIPASE, BLOOD - Abnormal; Notable for the following:    Lipase 68 (*)    All other components within normal limits  FIBRIN DERIVATIVES D-DIMER ([facility] ONLY) - Abnormal; Notable for the following:    Fibrin derivatives D-dimer ([facility]) 546 (*)    All other components within normal limits  TROPONIN I  PROTIME-INR  APTT   ____________________________________________  EKG  ED ECG REPORT I, Joanne Gavel, the attending physician, personally viewed and interpreted this ECG.   Date: 06/29/2015  EKG Time: 12:12  Rate: 82  Rhythm: normal EKG, normal sinus rhythm  Axis: normal  Intervals:none  ST&T Change: No acute ST elevation.  ____________________________________________  RADIOLOGY  CT angio chest IMPRESSION: Normal study. No pulmonary emboli. No other abnormality seen to explain interscapular pain.   ____________________________________________   PROCEDURES  Procedure(s) performed: None  Critical Care performed: No  ____________________________________________   INITIAL IMPRESSION / ASSESSMENT AND PLAN / ED COURSE  Pertinent labs & imaging results that were available during my care of the patient were reviewed by me and considered in my medical decision making (see chart for details).  Denise [name] is a 49 y.o. female with hypertension, GERD, status post hysterectomy approximately 3 weeks ago who presents for evaluation of 5 days gradual onset left upper thoracic back pain, she describes as spasms. On exam, she is very well-appearing and  in no acute distress. Vital signs stable, she is afebrile. She does have reproducible tenderness to palpation in the left upper thoracic paravertebral muscles and I suspect her back pain is musculoskeletal in nature. She does not have any shortness of breath, no chest pain and I think PE is less likely in this patient. We discussed radiation risks of CT scan and she does not wish to before but with that test "unless absolutely necessary". Check basic labs and d-dimer, treat her pain. Reassess for disposition.   ----------------------------------------- 2:19 PM on 06/29/2015 ----------------------------------------- D-dimer elevated, we'll pursue CTA chest.  ----------------------------------------- 4:21 PM on 06/29/2015 ----------------------------------------- Labs reviewed and are notable for mild leukocytosis, mild elevation of alkaline phosphatase. Negative troponin. Normal coags. Lipase mildly elevated at 68. CTA chest negative for PE. Suspect the patient is having musculoskeletal back pain, no concern for cauda equina or epidural abscess however given her lipase is elevated and she now reports that the pain goes from her back to the front of her abdomen, this may be mild pancreatitis. She is status post cholecystectomy 14 years ago, does not require ultrasound of the right upper quadrant today. She has no abdominal tenderness, is tolerating by mouth intake, is hydrating well. She is prescribed oxycodone for pain from her operation. We discussed continuing that medication, follow up with her primary care doctor, we discussed immediate return precautions and she is comfortable with the discharge plan. She has no urinary complaints and is wanting to go home so will not obtain a UA at this time as I doubt urinary tract infection as the cause of her pain.  ____________________________________________   FINAL CLINICAL IMPRESSION(S) / ED DIAGNOSES  Final diagnoses:  Muscle spasm of back  Acute  pancreatitis, unspecified pancreatitis type      Joanne Gavel, MD 06/29/15 1626

## 2015-06-29 NOTE — ED Notes (Signed)
MD at bedside. 

## 2015-06-29 NOTE — ED Notes (Addendum)
Pt reports hysterectomy in beginning of October, reports little movement for first 2 weeks. reports constipation in last October. Pt reports Sunday started with back pain between her shoulders (mid back), reports pain has increased all week. Pt called PCP and sent here for CT for PE. Pt reports nausea after eating.

## 2015-06-29 NOTE — Discharge Instructions (Signed)
Your blood pressure was a bit elevated here in the emergency department. This may be due to pain but it needs to be rechecked by your doctor next week to make sure it resolves. Return immediately to the emergency department if you develop severe or worsening pain, recurrent vomiting, blood in vomit or stools, fevers, abdominal pain, chest pain, difficulty breathing, numbness or weakness, bowel or bladder incontinence, or for any other concerns.

## 2015-07-06 ENCOUNTER — Telehealth: Payer: Self-pay | Admitting: Gastroenterology

## 2015-07-06 NOTE — Telephone Encounter (Signed)
Received referral from [facility], Dr. Gentry Fitz. Dr. Monica Becton is requesting that patient see Dr. Ardis Hughs for elevated lipase. There are no available appointments with Dr. Ardis Hughs and patient states that she cannot wait until December to see a PA.(first available that I could schedule). Patient states that she is still L side pain and back pain. She states that she does not have any nausea now. Referral sent to Patty L. Please advise as to scheduling.

## 2015-07-06 NOTE — Telephone Encounter (Signed)
Pt scheduled to see Amy Esterwood PA 07/16/15@1 :30pm. Please notify pt of appt.

## 2015-07-06 NOTE — Telephone Encounter (Signed)
Informed patient of this.  °

## 2015-07-16 ENCOUNTER — Ambulatory Visit (INDEPENDENT_AMBULATORY_CARE_PROVIDER_SITE_OTHER): Payer: BLUE CROSS/BLUE SHIELD | Admitting: Physician Assistant

## 2015-07-16 ENCOUNTER — Other Ambulatory Visit (INDEPENDENT_AMBULATORY_CARE_PROVIDER_SITE_OTHER): Payer: BLUE CROSS/BLUE SHIELD

## 2015-07-16 ENCOUNTER — Encounter: Payer: Self-pay | Admitting: Physician Assistant

## 2015-07-16 VITALS — BP 130/84 | HR 88 | Ht 64.0 in | Wt 196.4 lb

## 2015-07-16 DIAGNOSIS — M545 Low back pain, unspecified: Secondary | ICD-10-CM

## 2015-07-16 DIAGNOSIS — K859 Acute pancreatitis without necrosis or infection, unspecified: Secondary | ICD-10-CM | POA: Diagnosis not present

## 2015-07-16 DIAGNOSIS — R110 Nausea: Secondary | ICD-10-CM

## 2015-07-16 DIAGNOSIS — R1012 Left upper quadrant pain: Secondary | ICD-10-CM

## 2015-07-16 DIAGNOSIS — I10 Essential (primary) hypertension: Secondary | ICD-10-CM

## 2015-07-16 DIAGNOSIS — Z9049 Acquired absence of other specified parts of digestive tract: Secondary | ICD-10-CM

## 2015-07-16 DIAGNOSIS — Z90710 Acquired absence of both cervix and uterus: Secondary | ICD-10-CM

## 2015-07-16 LAB — CBC WITH DIFFERENTIAL/PLATELET
BASOS ABS: 0 10*3/uL (ref 0.0–0.1)
Basophils Relative: 0.4 % (ref 0.0–3.0)
EOS ABS: 0.2 10*3/uL (ref 0.0–0.7)
Eosinophils Relative: 1.9 % (ref 0.0–5.0)
HEMATOCRIT: 38.7 % (ref 36.0–46.0)
Hemoglobin: 12.5 g/dL (ref 12.0–15.0)
LYMPHS ABS: 3.1 10*3/uL (ref 0.7–4.0)
LYMPHS PCT: 29.7 % (ref 12.0–46.0)
MCHC: 32.3 g/dL (ref 30.0–36.0)
MCV: 80.8 fl (ref 78.0–100.0)
MONOS PCT: 7.9 % (ref 3.0–12.0)
Monocytes Absolute: 0.8 10*3/uL (ref 0.1–1.0)
NEUTROS ABS: 6.3 10*3/uL (ref 1.4–7.7)
NEUTROS PCT: 60.1 % (ref 43.0–77.0)
PLATELETS: 381 10*3/uL (ref 150.0–400.0)
RBC: 4.79 Mil/uL (ref 3.87–5.11)
RDW: 15.4 % (ref 11.5–15.5)
WBC: 10.5 10*3/uL (ref 4.0–10.5)

## 2015-07-16 LAB — COMPREHENSIVE METABOLIC PANEL
ALT: 21 U/L (ref 0–35)
AST: 17 U/L (ref 0–37)
Albumin: 3.9 g/dL (ref 3.5–5.2)
Alkaline Phosphatase: 147 U/L — ABNORMAL HIGH (ref 39–117)
BUN: 11 mg/dL (ref 6–23)
CALCIUM: 9.2 mg/dL (ref 8.4–10.5)
CHLORIDE: 101 meq/L (ref 96–112)
CO2: 26 meq/L (ref 19–32)
Creatinine, Ser: 0.7 mg/dL (ref 0.40–1.20)
GFR: 94.3 mL/min (ref >60.00)
Glucose, Bld: 89 mg/dL (ref 70–99)
Potassium: 3.8 mEq/L (ref 3.5–5.1)
Sodium: 137 mEq/L (ref 135–145)
TOTAL PROTEIN: 7.1 g/dL (ref 6.0–8.3)
Total Bilirubin: 0.3 mg/dL (ref 0.2–1.2)

## 2015-07-16 LAB — LIPASE: Lipase: 52 U/L (ref 11.0–59.0)

## 2015-07-16 MED ORDER — TRAMADOL HCL 50 MG PO TABS: 50.0000 mg | ORAL_TABLET | Freq: Four times a day (QID) | ORAL | Status: DC | PRN

## 2015-07-16 NOTE — Progress Notes (Signed)
Patient ID: Denise [name], female   DOB: August 03, 1966, 49 y.o.   MRN: [id number]   Subjective:    Patient ID: Denise [name], female    DOB: 1966/07/17, 49 y.o.   MRN: [id number]  HPI  [name] is a pleasant 49 year old white female new to GI today who comes in after recent ER evaluation on November 11 at [facility] at which time she presented with nausea /vomiting and left back pain. She had undergone an abdominal hysterectomy about 3 weeks previous. She was therefore ruled out for pulmonary embolus with CT angioma which was negative. Labs at that time showed a WBC of 12,000 hemoglobin 12 hematocrit of 38 alkaline phosphatase slightly elevated at 134 and lipase was 68. D-dimer was positive. Patient is status post remote cholecystectomy 12-13 years ago and also status post appendectomy. She says that she is still having problems. Her symptoms started in early November after she had a eaten a spicy chicken meal. She began with nausea and back pain that evening which continued on into the next day she continued to have symptoms which progressed and within 2 days she was having constant nausea and left upper quadrant pain radiating straight through into her back. She now gets some radiation up into her left scapula which she says the pain never goes away but is worsened by larger heavier fattier meals. She's been eating very very bland which is not exacerbating her pain much but 8 more for Thanksgiving dinner including pecan pie and then felt much worse that evening and the following day. Is not having any problems with diarrhea melena or hematochezia and no fever or chills. Started any new meds is on no regular NSAIDs no EtOH.  Review of Systems Pertinent positive and negative review of systems were noted in the above HPI section.  All other review of systems was otherwise negative.  Outpatient Encounter Prescriptions as of 07/16/2015  Medication Sig  . cyclobenzaprine (FLEXERIL) 10 MG tablet Take 10 mg by  mouth as needed for muscle spasms (cramps).   . docusate sodium (COLACE) 100 MG capsule Take 100 mg by mouth daily.  Marland Kitchen ibuprofen (ADVIL,MOTRIN) 600 MG tablet Take 600 mg by mouth every 6 (six) hours as needed.  Marland Kitchen lisinopril (PRINIVIL,ZESTRIL) 20 MG tablet Take 20 mg by mouth daily.  . methocarbamol (ROBAXIN) 500 MG tablet Take 500 mg by mouth every 6 (six) hours as needed for muscle spasms.  . metoprolol succinate (TOPROL-XL) 25 MG 24 hr tablet Take 25 mg by mouth daily.  Marland Kitchen omeprazole (PRILOSEC) 40 MG capsule Take 40 mg by mouth daily.  Marland Kitchen oxyCODONE-acetaminophen (PERCOCET) 10-325 MG per tablet Take 1 tablet by mouth every 4 (four) hours as needed for pain. (Patient taking differently: Take 1 tablet by mouth as needed for pain. )  . valACYclovir (VALTREX) 1000 MG tablet Take 1,000 mg by mouth 2 (two) times daily as needed (for fever blisters).  . traMADol (ULTRAM) 50 MG tablet Take 1 tablet (50 mg total) by mouth every 6 (six) hours as needed.  . [DISCONTINUED] cetirizine (ZYRTEC) 10 MG tablet Take 10 mg by mouth daily.  . [DISCONTINUED] Cholecalciferol 2000 UNITS TABS Take 1 tablet by mouth daily.  . [DISCONTINUED] Multiple Vitamins-Minerals (MULTIVITAMIN WITH MINERALS) tablet Take 1 tablet by mouth daily.  . [DISCONTINUED] naproxen sodium (ANAPROX) 220 MG tablet Take 220 mg by mouth daily as needed (headache).  . [DISCONTINUED] ondansetron (ZOFRAN ODT) 4 MG disintegrating tablet Take 1 tablet (4 mg total) by mouth  every 8 (eight) hours as needed for nausea or vomiting.   No facility-administered encounter medications on file as of 07/16/2015.   Allergies  Allergen Reactions  . Latex Hives  . Sulfa Antibiotics Hives   Patient Active Problem List   Diagnosis Date Noted  . HTN (hypertension) 07/16/2015  . S/P cholecystectomy 07/16/2015  . S/P abdominal hysterectomy 07/16/2015  . S/P appendectomy 07/16/2015   Social History   Social History  . Marital Status: Married    Spouse Name:  N/A  . Number of Children: 1  . Years of Education: N/A   Occupational History  . sales    Social History Main Topics  . Smoking status: Never Smoker   . Smokeless tobacco: Never Used  . Alcohol Use: Yes     Comment: 1 drink/month  . Drug Use: No  . Sexual Activity: Not on file   Other Topics Concern  . Not on file   Social History Narrative    Ms. Younker's family history includes Diabetes in her father; Hyperlipidemia in her brother and mother; Hypertension in her brother and mother; Lung cancer in her maternal grandfather and paternal uncle.      Objective:    Filed Vitals:   07/16/15 1334  BP: 130/84  Pulse: 88    Physical Exam  well-developed white female in no acute distress, pleasant blood pressure 130/84 pulse 88 height 5 foot 4 weight 196. HEENT ;nontraumatic normocephalic EOMI PERRLA, Sclera anicteric, Supple ;no JVD, Cardiovascular; regular rate and rhythm with S1-S2 no murmur or gallop, Pulmonary; clear bilaterally, Abdomen ;soft, she has a healing low incisional scar no palpable mass or hepatosplenomegaly mild tenderness to deep palpation of left upper quadrant and some tenderness over the left anterior ribs, Rectal; exam not done, Ext; no clubbing cyanosis or edema skin warm dry, Neuropsych; mood and affect appropriate         Assessment & Plan:   #1 49 yo  Female with 2-3 weeks hx of LUQ pain radiating to left back, associated with nausea, and post prandial worsening of sxs. Elevated lipase suggestive of pancreatitis - etiology not clear, ,consider penetrating ulcer, malignancy #2 s/p hysterectomy/abdominal Nov 2016 #3 s/p cholecystectomy #4 HTN  Plan; continue  Omeprazole 40 mg po daily  Schedule for Ct abd/pelvis with contrast Zofran 4 mg po q 6 hours prn  Ultram 50 mg po q 6 hours prn  Bland ,low fat diet  Repeat CBC,CMET,lipase todaypt will be established with Dr Silverio Decamp.   Alfredia Ferguson PA-C 07/16/2015   Cc: [name],  MD

## 2015-07-16 NOTE — Patient Instructions (Addendum)
Please go to the basement level to have your labs drawn.  We have given you a low fat diet brochure. We have faxed a prescription for Tramadol ( Ultram) for pain. To [facility].  Use Zofran 4 mg by mouth every 6 hours as needed for nausea.    You have been scheduled for a CT scan of the abdomen and pelvis at [facility] ([address]---this is in the same building as [facility]).   You are scheduled on 07-18-2015 at 3:00 PM . You should arrive 15 minutes prior to your appointment time for registration. Please follow the written instructions below on the day of your exam:  WARNING: IF YOU ARE ALLERGIC TO IODINE/X-RAY DYE, PLEASE NOTIFY RADIOLOGY IMMEDIATELY AT [phone]! YOU WILL BE GIVEN A 13 HOUR PREMEDICATION PREP.  1) Do not eat or drink anything after 11:00 am  (4 hours prior to your test) 2) You have been given 2 bottles of oral contrast to drink. The solution may taste better if refrigerated, but do NOT add ice or any other liquid to this solution. Shake well before drinking.    Drink 1 bottle of contrast @ 1:00 PM  (2 hours prior to your exam)  Drink 1 bottle of contrast @ 2:00 PM  (1 hour prior to your exam)  You may take any medications as prescribed with a small amount of water except for the following: Metformin, Glucophage, Glucovance, Avandamet, Riomet, Fortamet, Actoplus Met, Janumet, Glumetza or Metaglip. The above medications must be held the day of the exam AND 48 hours after the exam.  The purpose of you drinking the oral contrast is to aid in the visualization of your intestinal tract. The contrast solution may cause some diarrhea. Before your exam is started, you will be given a small amount of fluid to drink. Depending on your individual set of symptoms, you may also receive an intravenous injection of x-ray contrast/dye. Plan on being at [facility] for 30 minutes or long, depending on the type of exam you are having performed.  If you  have any questions regarding your exam or if you need to reschedule, you may call the CT department at [phone] between the hours of 8:00 am and 5:00 pm, Monday-Friday.  ________________________________________________________________________

## 2015-07-17 NOTE — Progress Notes (Signed)
Reviewed and agree with documentation and assessment and plan. K. Veena Nandigam , MD   

## 2015-07-18 ENCOUNTER — Inpatient Hospital Stay: Admission: RE | Admit: 2015-07-18 | Payer: BLUE CROSS/BLUE SHIELD | Source: Ambulatory Visit

## 2015-07-18 ENCOUNTER — Ambulatory Visit (INDEPENDENT_AMBULATORY_CARE_PROVIDER_SITE_OTHER)
Admission: RE | Admit: 2015-07-18 | Discharge: 2015-07-18 | Disposition: A | Discharge status: Home / Self Care | Payer: BLUE CROSS/BLUE SHIELD | Source: Ambulatory Visit | Attending: Physician Assistant | Admitting: Physician Assistant

## 2015-07-18 DIAGNOSIS — M545 Low back pain, unspecified: Secondary | ICD-10-CM

## 2015-07-18 DIAGNOSIS — K859 Acute pancreatitis without necrosis or infection, unspecified: Secondary | ICD-10-CM

## 2015-07-18 DIAGNOSIS — R110 Nausea: Secondary | ICD-10-CM

## 2015-07-18 DIAGNOSIS — R1012 Left upper quadrant pain: Secondary | ICD-10-CM

## 2015-07-18 MED ORDER — IOHEXOL 300 MG/ML  SOLN
100.0000 mL | Freq: Once | INTRAMUSCULAR | Status: AC | PRN
  Administered 2015-07-18: 100 mL via INTRAVENOUS

## 2015-07-19 ENCOUNTER — Telehealth: Payer: Self-pay | Admitting: Physician Assistant

## 2015-07-19 NOTE — Telephone Encounter (Signed)
Patient advised her imaging is being reviewed.

## 2015-07-19 NOTE — Telephone Encounter (Signed)
Pt is calling back about her test results...she is in a lot of pain

## 2015-07-19 NOTE — Telephone Encounter (Signed)
Angie from Dr. Christen Butter office at [facility] states that Dr. Helane Rima had referred patient and is requesting results. P: [id number]

## 2015-07-23 ENCOUNTER — Telehealth: Payer: Self-pay | Admitting: Physician Assistant

## 2015-07-23 ENCOUNTER — Other Ambulatory Visit: Payer: Self-pay

## 2015-07-23 DIAGNOSIS — K862 Cyst of pancreas: Secondary | ICD-10-CM

## 2015-07-23 MED ORDER — LORAZEPAM 0.5 MG PO TABS: 0.5000 mg | ORAL_TABLET | Freq: Once | ORAL | Status: DC

## 2015-07-23 NOTE — Addendum Note (Signed)
Addended by: Virgina Evener A on: 07/23/2015 01:42 PM   Modules accepted: Orders

## 2015-07-26 ENCOUNTER — Telehealth: Payer: Self-pay | Admitting: Physician Assistant

## 2015-07-27 ENCOUNTER — Telehealth: Payer: Self-pay | Admitting: Physician Assistant

## 2015-07-27 NOTE — Telephone Encounter (Signed)
Error

## 2015-07-27 NOTE — Telephone Encounter (Signed)
MRI results received via fax.  Patient's spouse presents at the office. He states the patient is in "so much pain" in her abdomen. They were anxious to hear the results.  Concerning that patient is in pain. Encouraged to go to the ER if she is in extreme pain. Reports will be given to the provider to view. Reassured I will call asap with recommendations.

## 2015-07-27 NOTE — Telephone Encounter (Signed)
Spoke with patient. She agrees to come in on 07/31/15 at 9:00 am. The MRI does not show any cancer. It shows simple cysts on the pancreas. Nothing to explain the pain.

## 2015-07-27 NOTE — Telephone Encounter (Signed)
Patient calling in again regarding results. She is requesting a callback today. Best # [phone].

## 2015-07-31 ENCOUNTER — Other Ambulatory Visit (INDEPENDENT_AMBULATORY_CARE_PROVIDER_SITE_OTHER): Payer: BLUE CROSS/BLUE SHIELD

## 2015-07-31 ENCOUNTER — Encounter: Payer: Self-pay | Admitting: Physician Assistant

## 2015-07-31 ENCOUNTER — Ambulatory Visit (INDEPENDENT_AMBULATORY_CARE_PROVIDER_SITE_OTHER): Payer: BLUE CROSS/BLUE SHIELD | Admitting: Physician Assistant

## 2015-07-31 VITALS — BP 130/70 | HR 80 | Ht 64.0 in | Wt 198.8 lb

## 2015-07-31 DIAGNOSIS — R1012 Left upper quadrant pain: Secondary | ICD-10-CM

## 2015-07-31 DIAGNOSIS — R110 Nausea: Secondary | ICD-10-CM

## 2015-07-31 LAB — HEPATIC FUNCTION PANEL
ALK PHOS: 169 U/L — AB (ref 39–117)
ALT: 13 U/L (ref 0–35)
AST: 13 U/L (ref 0–37)
Albumin: 3.9 g/dL (ref 3.5–5.2)
BILIRUBIN TOTAL: 0.2 mg/dL (ref 0.2–1.2)
Bilirubin, Direct: 0 mg/dL (ref 0.0–0.3)
Total Protein: 7 g/dL (ref 6.0–8.3)

## 2015-07-31 LAB — AMYLASE: AMYLASE: 31 U/L (ref 27–131)

## 2015-07-31 LAB — LIPASE: LIPASE: 53 U/L (ref 11.0–59.0)

## 2015-07-31 MED ORDER — OMEPRAZOLE 40 MG PO CPDR: 40.0000 mg | DELAYED_RELEASE_CAPSULE | Freq: Two times a day (BID) | ORAL | Status: DC

## 2015-07-31 NOTE — Progress Notes (Signed)
Patient ID: Denise [name], female   DOB: Jan 02, 1966, 49 y.o.   MRN: [id number]   Subjective:    Patient ID: Denise [name], female    DOB: 31-Aug-1965, 49 y.o.   MRN: [id number]  HPI  Denise [name] is a pleasant 49 year old white female recently established with Dr. Sherrlyn Hock again. She was seen on November 28 with complaints of 2-3 week history of fairly intense left upper quadrant pain radiating to the left back and associated with nausea and some postprandial exacerbation of symptoms. He had had an ER visit and had a mildly elevated lipase suggestive of pancreatitis Patient had undergone hysterectomy a couple of weeks prior to onset of those symptoms. She is status post cholecystectomy. An We obtained CT of the abdomen and pelvis which did not show any evidence of pancreatitis but did show a 10 mm structure in the tail of the pancreas is consistent with a small pseudocyst, she also had a multiloculated structure in the left adnexa probably a complicated ovarian cyst and a solid appearing structure in the right pelvis associated with the vaginal cuff. She has since been to see her gynecologist who is monitoring the cyst. We pursued MRI because of the structure in the tail of the pancreas. This showed mild hepatic steatosis no ductal dilation. A 1.0 x 0.8 x 1.0 cm nonenhancing cystic lesion in the distal pancreatic body and then at least 5 other similar sub-centimeter scattered cysts. 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 She also had small renal cysts. Repeat labs on 1028 showed an alkaline phosphatase of 147 lipase of 52 CBC was within normal limits as were other liver function studies. Patient comes back in today for follow-up. She had been very worried after the MRI findings. She says however her pain has gone from a 10 down to a 2 at this point and she is not requiring any pain medication. She says she can still feel it and sometimes eating seems to  make this a little bit worse. She also describes a tingling type of pain which radiates straight through to her back or nausea or vomiting appetites has improved. She also mentions a pain in her left upper back which she says feels like somebody heads punched her in the back.  Review of Systems Pertinent positive and negative review of systems were noted in the above HPI section.  All other review of systems was otherwise negative.    Outpatient Encounter Prescriptions as of 07/31/2015  Medication Sig  . cyclobenzaprine (FLEXERIL) 10 MG tablet Take 10 mg by mouth as needed for muscle spasms (cramps).   . docusate sodium (COLACE) 100 MG capsule Take 100 mg by mouth daily.  Marland Kitchen ibuprofen (ADVIL,MOTRIN) 600 MG tablet Take 600 mg by mouth every 6 (six) hours as needed.  Marland Kitchen lisinopril (PRINIVIL,ZESTRIL) 20 MG tablet Take 20 mg by mouth daily.  Marland Kitchen LORazepam (ATIVAN) 0.5 MG tablet Take 1 tablet (0.5 mg total) by mouth once. Take 30 minutes to 1 hour prior to MRI. May repeat if needed.  . methocarbamol (ROBAXIN) 500 MG tablet Take 500 mg by mouth every 6 (six) hours as needed for muscle spasms.  . metoprolol succinate (TOPROL-XL) 25 MG 24 hr tablet Take 25 mg by mouth daily.  Marland Kitchen omeprazole (PRILOSEC) 40 MG capsule Take 1 capsule (40 mg total) by mouth 2 (two) times daily.  Marland Kitchen oxyCODONE-acetaminophen (PERCOCET) 10-325 MG per tablet Take 1 tablet by mouth every 4 (  four) hours as needed for pain. (Patient taking differently: Take 1 tablet by mouth as needed for pain. )  . traMADol (ULTRAM) 50 MG tablet Take 1 tablet (50 mg total) by mouth every 6 (six) hours as needed.  . valACYclovir (VALTREX) 1000 MG tablet Take 1,000 mg by mouth 2 (two) times daily as needed (for fever blisters).  . [DISCONTINUED] omeprazole (PRILOSEC) 40 MG capsule Take 40 mg by mouth 2 (two) times daily.   No facility-administered encounter medications on file as of 07/31/2015.   Allergies  Allergen Reactions  . Latex Hives  . Sulfa  Antibiotics Hives   Patient Active Problem List   Diagnosis Date Noted  . HTN (hypertension) 07/16/2015  . S/P cholecystectomy 07/16/2015  . S/P abdominal hysterectomy 07/16/2015  . S/P appendectomy 07/16/2015   Social History   Social History  . Marital Status: Married    Spouse Name: N/A  . Number of Children: 1  . Years of Education: N/A   Occupational History  . sales    Social History Main Topics  . Smoking status: Never Smoker   . Smokeless tobacco: Never Used  . Alcohol Use: 0.0 oz/week    0 Standard drinks or equivalent per week     Comment: social  . Drug Use: No  . Sexual Activity: Not on file   Other Topics Concern  . Not on file   Social History Narrative    Ms. Colver's family history includes Colon cancer in her maternal uncle; Diabetes in her father; Hyperlipidemia in her brother and mother; Hypertension in her brother and mother; Lung cancer in her maternal grandfather and paternal uncle.      Objective:    Filed Vitals:   07/31/15 0916  BP: 130/70  Pulse: 80    Physical Exam  well-developed white female in no acute distress, pleasant blood pressure 130/70 pulse 80 height 5 foot 4 weight 198. HEENT ;nontraumatic normocephalic EOMI PERRLA sclera anicteric, Cardiovascular; regular rate and rhythm with S1-S2 no murmur or gallop, Pulmonary ;clear bilaterally, Abdomen ;soft, basically nontender there is no palpable mass or hepatosplenomegaly, she is definitely tender over the right lower anterior costal margin. 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 Procedure discussed in detail with pt and she is agreeable to proceed. Repeat Lipase, hepatic panel today Plan to repeat MRI in one year , unless other sxs warrant sooner re-evaluation  Syair Fricker S Deborrah Mabin PA-C 07/31/2015   Cc: [name], MD

## 2015-07-31 NOTE — Patient Instructions (Signed)
We will get back to you with your lab results. Increase your Prilosec 40 mg to twice daily.  You have been scheduled for an endoscopy. Please follow written instructions given to you at your visit today. If you use inhalers (even only as needed), please bring them with you on the day of your procedure. Your physician has requested that you go to www.startemmi.com and enter the access code given to you at your visit today. This web site gives a general overview about your procedure. However, you should still follow specific instructions given to you by our office regarding your preparation for the procedure.

## 2015-07-31 NOTE — Progress Notes (Signed)
Reviewed and agree with documentation and assessment and plan. K. Veena Guido Comp , MD   

## 2015-08-01 ENCOUNTER — Telehealth: Payer: Self-pay | Admitting: *Deleted

## 2015-08-01 ENCOUNTER — Other Ambulatory Visit: Payer: Self-pay

## 2015-08-01 ENCOUNTER — Other Ambulatory Visit: Payer: BLUE CROSS/BLUE SHIELD

## 2015-08-01 DIAGNOSIS — R1012 Left upper quadrant pain: Secondary | ICD-10-CM

## 2015-08-01 NOTE — Telephone Encounter (Signed)
LM on the patient's cell number [phone] that she is not to take Ibuprofen but she can take Tylenol or Extra Strength Tylenol.

## 2015-08-03 LAB — GAMMA GT: GGT: 31 IU/L (ref 0–60)

## 2015-08-03 LAB — NUCLEOTIDASE, 5', BLOOD: 5-Nucleotidase: 4 IU/L (ref 0–10)

## 2015-08-06 ENCOUNTER — Telehealth: Payer: Self-pay | Admitting: Physician Assistant

## 2015-08-06 NOTE — Telephone Encounter (Signed)
Advised patient her results are normal except Alk. Phos. Additional labs added are normal but un-reviewed.

## 2015-08-07 ENCOUNTER — Encounter (HOSPITAL_COMMUNITY): Payer: Self-pay | Admitting: *Deleted

## 2015-08-07 ENCOUNTER — Ambulatory Visit (HOSPITAL_COMMUNITY): Payer: BLUE CROSS/BLUE SHIELD

## 2015-08-07 NOTE — Progress Notes (Signed)
Pt denies SOB, chest pain and being under the care of a cardiologist. Pt made aware to stop taking  Aspirin, otc vitamins, fish oil, and herbal medications. Do not take any NSAIDs ie: Ibuprofen, Advil, Naproxen or any medication containing Aspirin. Pt verbalized understanding of all pre-op instructions.

## 2015-08-08 ENCOUNTER — Ambulatory Visit (HOSPITAL_COMMUNITY): Payer: BLUE CROSS/BLUE SHIELD | Admitting: Certified Registered Nurse Anesthetist

## 2015-08-08 ENCOUNTER — Ambulatory Visit (HOSPITAL_COMMUNITY)
Admission: RE | Admit: 2015-08-08 | Discharge: 2015-08-08 | Disposition: A | Discharge status: Home / Self Care | Payer: BLUE CROSS/BLUE SHIELD | Source: Ambulatory Visit | Attending: Gastroenterology | Admitting: Gastroenterology

## 2015-08-08 ENCOUNTER — Encounter (HOSPITAL_COMMUNITY): Payer: Self-pay

## 2015-08-08 ENCOUNTER — Encounter (HOSPITAL_COMMUNITY)
Admission: RE | Disposition: A | Discharge status: Home / Self Care | Payer: Self-pay | Source: Ambulatory Visit | Attending: Gastroenterology

## 2015-08-08 DIAGNOSIS — I10 Essential (primary) hypertension: Secondary | ICD-10-CM | POA: Diagnosis not present

## 2015-08-08 DIAGNOSIS — K317 Polyp of stomach and duodenum: Secondary | ICD-10-CM | POA: Insufficient documentation

## 2015-08-08 DIAGNOSIS — K219 Gastro-esophageal reflux disease without esophagitis: Secondary | ICD-10-CM | POA: Diagnosis not present

## 2015-08-08 DIAGNOSIS — Z90710 Acquired absence of both cervix and uterus: Secondary | ICD-10-CM | POA: Diagnosis not present

## 2015-08-08 DIAGNOSIS — R1012 Left upper quadrant pain: Secondary | ICD-10-CM | POA: Insufficient documentation

## 2015-08-08 DIAGNOSIS — Z9049 Acquired absence of other specified parts of digestive tract: Secondary | ICD-10-CM | POA: Diagnosis not present

## 2015-08-08 DIAGNOSIS — R1310 Dysphagia, unspecified: Secondary | ICD-10-CM | POA: Insufficient documentation

## 2015-08-08 DIAGNOSIS — R110 Nausea: Secondary | ICD-10-CM

## 2015-08-08 DIAGNOSIS — R1013 Epigastric pain: Secondary | ICD-10-CM | POA: Diagnosis present

## 2015-08-08 HISTORY — PX: ESOPHAGOGASTRODUODENOSCOPY (EGD) WITH PROPOFOL: SHX5813

## 2015-08-08 HISTORY — DX: Cyst of pancreas: K86.2

## 2015-08-08 HISTORY — DX: Abnormal levels of other serum enzymes: R74.8

## 2015-08-08 SURGERY — ESOPHAGOGASTRODUODENOSCOPY (EGD) WITH PROPOFOL
Anesthesia: Monitor Anesthesia Care

## 2015-08-08 MED ORDER — ONDANSETRON HCL 4 MG/2ML IJ SOLN
INTRAMUSCULAR | Status: DC | PRN
  Administered 2015-08-08: 4 mg via INTRAVENOUS

## 2015-08-08 MED ORDER — LACTATED RINGERS IV SOLN
INTRAVENOUS | Status: DC
  Administered 2015-08-08: 10:00:00 via INTRAVENOUS
  Administered 2015-08-08: 1000 mL via INTRAVENOUS

## 2015-08-08 MED ORDER — PROPOFOL 500 MG/50ML IV EMUL
INTRAVENOUS | Status: DC | PRN
  Administered 2015-08-08: 100 ug/kg/min via INTRAVENOUS

## 2015-08-08 MED ORDER — PROPOFOL 10 MG/ML IV BOLUS
INTRAVENOUS | Status: DC | PRN
  Administered 2015-08-08 (×4): 20 mg via INTRAVENOUS

## 2015-08-08 MED ORDER — SODIUM CHLORIDE 0.9 % IV SOLN: INTRAVENOUS | Status: DC

## 2015-08-08 NOTE — Op Note (Signed)
[facility] [place], [id number]   ENDOSCOPY PROCEDURE REPORT  PATIENT: Denise [name], Denise [name]  MR#: [id number] BIRTHDATE: [birth year] , 49  yrs. old GENDER: female ENDOSCOPIST: Harl Bowie, MD REFERRED BY:  [name], MD PROCEDURE DATE:  08/08/2015 PROCEDURE:  EGD, diagnostic and EGD w/ snare technique ASA CLASS:     Class III INDICATIONS:  dyspepsia and dysphagia. MEDICATIONS: Monitored anesthesia care TOPICAL ANESTHETIC: none  DESCRIPTION OF PROCEDURE: After the risks benefits and alternatives of the procedure were thoroughly explained, informed consent was obtained.  The    endoscope was introduced through the mouth and advanced to the second portion of the duodenum , Without limitations.  The instrument was slowly withdrawn as the mucosa was fully examined.    ESOPHAGUS: The mucosa of the esophagus appeared normal.   Regular Z line.  Multiple fundic gastric polyps greater than 1 cm in size, 5 polyps removed with hot snare for sampling.  Otherwise gastric mucosa appeared normal.  Duodenal bulb and duodenum appeared normal.  Retroflexed views revealed as previously described. The scope was then withdrawn from the patient and the procedure completed.  COMPLICATIONS: There were no immediate complications.  ENDOSCOPIC IMPRESSION: 1.    Multiple fundic gastric polyps greater than 1 cm in size, 5 polyps removed with hot snare for sampling.  Otherwise gastric mucosa appeared normal  RECOMMENDATIONS: Await pathology results    eSigned:  Harl Bowie, MD 08/08/2015 10:35 AM

## 2015-08-08 NOTE — Interval H&P Note (Signed)
History and Physical Interval Note:  08/08/2015 9:30 AM  Denise [name]  has presented today for EGD, with the diagnosis of LUQ abd pain and Nausea  The various methods of treatment have been discussed with the patient and family. After consideration of risks, benefits and other options for treatment, the patient has consented to  Procedure(s): ESOPHAGOGASTRODUODENOSCOPY (EGD) WITH PROPOFOL (N/A) as a surgical intervention .  The patient's history has been reviewed, patient examined, no change in status, stable for surgery.  I have reviewed the patient's chart and labs.  Questions were answered to the patient's satisfaction.     Harl Bowie, MD

## 2015-08-08 NOTE — H&P (View-Only) (Signed)
Patient ID: Denise [name], female   DOB: [birth year], 49 y.o.   MRN: [id number]   Subjective:    Patient ID: Denise [name], female    DOB: 11/07/1965, 49 y.o.   MRN: [id number]  HPI  [name] is a pleasant 49 year old white female recently established with Dr. Sherrlyn Hock again. She was seen on November 28 with complaints of 2-3 week history of fairly intense left upper quadrant pain radiating to the left back and associated with nausea and some postprandial exacerbation of symptoms. He had had an ER visit and had a mildly elevated lipase suggestive of pancreatitis Patient had undergone hysterectomy a couple of weeks prior to onset of those symptoms. She is status post cholecystectomy. An We obtained CT of the abdomen and pelvis which did not show any evidence of pancreatitis but did show a 10 mm structure in the tail of the pancreas is consistent with a small pseudocyst, she also had a multiloculated structure in the left adnexa probably a complicated ovarian cyst and a solid appearing structure in the right pelvis associated with the vaginal cuff. She has since been to see her gynecologist who is monitoring the cyst. We pursued MRI because of the structure in the tail of the pancreas. This showed mild hepatic steatosis no ductal dilation. A 1.0 x 0.8 x 1.0 cm nonenhancing cystic lesion in the distal pancreatic body and then at least 5 other similar sub-centimeter scattered cysts. 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 She also had small renal cysts. Repeat labs on 1028 showed an alkaline phosphatase of 147 lipase of 52 CBC was within normal limits as were other liver function studies. Patient comes back in today for follow-up. She had been very worried after the MRI findings. She says however her pain has gone from a 10 down to a 2 at this point and she is not requiring any pain medication. She says she can still feel it and sometimes eating seems to  make this a little bit worse. She also describes a tingling type of pain which radiates straight through to her back or nausea or vomiting appetites has improved. She also mentions a pain in her left upper back which she says feels like somebody heads punched her in the back.  Review of Systems Pertinent positive and negative review of systems were noted in the above HPI section.  All other review of systems was otherwise negative.    Outpatient Encounter Prescriptions as of 07/31/2015  Medication Sig  . cyclobenzaprine (FLEXERIL) 10 MG tablet Take 10 mg by mouth as needed for muscle spasms (cramps).   . docusate sodium (COLACE) 100 MG capsule Take 100 mg by mouth daily.  Marland Kitchen ibuprofen (ADVIL,MOTRIN) 600 MG tablet Take 600 mg by mouth every 6 (six) hours as needed.  Marland Kitchen lisinopril (PRINIVIL,ZESTRIL) 20 MG tablet Take 20 mg by mouth daily.  Marland Kitchen LORazepam (ATIVAN) 0.5 MG tablet Take 1 tablet (0.5 mg total) by mouth once. Take 30 minutes to 1 hour prior to MRI. May repeat if needed.  . methocarbamol (ROBAXIN) 500 MG tablet Take 500 mg by mouth every 6 (six) hours as needed for muscle spasms.  . metoprolol succinate (TOPROL-XL) 25 MG 24 hr tablet Take 25 mg by mouth daily.  Marland Kitchen omeprazole (PRILOSEC) 40 MG capsule Take 1 capsule (40 mg total) by mouth 2 (two) times daily.  Marland Kitchen oxyCODONE-acetaminophen (PERCOCET) 10-325 MG per tablet Take 1 tablet by mouth every 4 (  four) hours as needed for pain. (Patient taking differently: Take 1 tablet by mouth as needed for pain. )  . traMADol (ULTRAM) 50 MG tablet Take 1 tablet (50 mg total) by mouth every 6 (six) hours as needed.  . valACYclovir (VALTREX) 1000 MG tablet Take 1,000 mg by mouth 2 (two) times daily as needed (for fever blisters).  . [DISCONTINUED] omeprazole (PRILOSEC) 40 MG capsule Take 40 mg by mouth 2 (two) times daily.   No facility-administered encounter medications on file as of 07/31/2015.   Allergies  Allergen Reactions  . Latex Hives  . Sulfa  Antibiotics Hives   Patient Active Problem List   Diagnosis Date Noted  . HTN (hypertension) 07/16/2015  . S/P cholecystectomy 07/16/2015  . S/P abdominal hysterectomy 07/16/2015  . S/P appendectomy 07/16/2015   Social History   Social History  . Marital Status: Married    Spouse Name: N/A  . Number of Children: 1  . Years of Education: N/A   Occupational History  . sales    Social History Main Topics  . Smoking status: Never Smoker   . Smokeless tobacco: Never Used  . Alcohol Use: 0.0 oz/week    0 Standard drinks or equivalent per week     Comment: social  . Drug Use: No  . Sexual Activity: Not on file   Other Topics Concern  . Not on file   Social History Narrative    Ms. Brucato's family history includes Colon cancer in her maternal uncle; Diabetes in her father; Hyperlipidemia in her brother and mother; Hypertension in her brother and mother; Lung cancer in her maternal grandfather and paternal uncle.      Objective:    Filed Vitals:   07/31/15 0916  BP: 130/70  Pulse: 80    Physical Exam  well-developed white female in no acute distress, pleasant blood pressure 130/70 pulse 80 height 5 foot 4 weight 198. HEENT ;nontraumatic normocephalic EOMI PERRLA sclera anicteric, Cardiovascular; regular rate and rhythm with S1-S2 no murmur or gallop, Pulmonary ;clear bilaterally, Abdomen ;soft, basically nontender there is no palpable mass or hepatosplenomegaly, she is definitely tender over the right lower anterior costal margin. 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 Procedure discussed in detail with pt and she is agreeable to proceed. Repeat Lipase, hepatic panel today Plan to repeat MRI in one year , unless other sxs warrant sooner re-evaluation  Amy S Esterwood PA-C 07/31/2015   Cc: [name], MD

## 2015-08-08 NOTE — Transfer of Care (Signed)
Immediate Anesthesia Transfer of Care Note  Patient: Denise [name]  Procedure(s) Performed: Procedure(s): ESOPHAGOGASTRODUODENOSCOPY (EGD) WITH PROPOFOL (N/A)  Patient Location: PACU  Anesthesia Type:General  Level of Consciousness: patient cooperative and responds to stimulation  Airway & Oxygen Therapy: Patient Spontanous Breathing and Patient connected to nasal cannula oxygen  Post-op Assessment: Report given to RN and Post -op Vital signs reviewed and stable  Post vital signs: Reviewed and stable  Last Vitals:  Filed Vitals:   08/08/15 0842  BP: 148/85  Pulse: 89  Temp: 36.7 C  Resp: 19    Complications: No apparent anesthesia complications

## 2015-08-08 NOTE — Discharge Instructions (Signed)

## 2015-08-08 NOTE — Anesthesia Preprocedure Evaluation (Signed)
Anesthesia Evaluation  Patient identified by MRN, date of birth, ID band Patient awake    Reviewed: Allergy & Precautions, NPO status , Patient's Chart, lab work & pertinent test results  Airway Mallampati: I  TM Distance: >3 FB Neck ROM: Full    Dental   Pulmonary    Pulmonary exam normal        Cardiovascular hypertension, Pt. on medications Normal cardiovascular exam     Neuro/Psych    GI/Hepatic GERD  Medicated and Controlled,  Endo/Other    Renal/GU      Musculoskeletal   Abdominal   Peds  Hematology   Anesthesia Other Findings   Reproductive/Obstetrics                             Anesthesia Physical Anesthesia Plan  ASA: II  Anesthesia Plan: MAC   Post-op Pain Management:    Induction: Intravenous  Airway Management Planned: Simple Face Mask  Additional Equipment:   Intra-op Plan:   Post-operative Plan:   Informed Consent: I have reviewed the patients History and Physical, chart, labs and discussed the procedure including the risks, benefits and alternatives for the proposed anesthesia with the patient or authorized representative who has indicated his/her understanding and acceptance.     Plan Discussed with: CRNA and Surgeon  Anesthesia Plan Comments:         Anesthesia Quick Evaluation

## 2015-08-08 NOTE — Anesthesia Postprocedure Evaluation (Signed)
Anesthesia Post Note  Patient: Denise [name]  Procedure(s) Performed: Procedure(s) (LRB): ESOPHAGOGASTRODUODENOSCOPY (EGD) WITH PROPOFOL (N/A)  Patient location during evaluation: PACU Anesthesia Type: MAC Level of consciousness: awake and alert Pain management: pain level controlled Vital Signs Assessment: post-procedure vital signs reviewed and stable Respiratory status: spontaneous breathing, nonlabored ventilation, respiratory function stable and patient connected to nasal cannula oxygen Cardiovascular status: stable and blood pressure returned to baseline Anesthetic complications: no    Last Vitals:  Filed Vitals:   08/08/15 1050 08/08/15 1100  BP: 167/98   Pulse: 73 73  Temp:    Resp: 26 19    Last Pain: There were no vitals filed for this visit.               [facility]

## 2015-08-10 ENCOUNTER — Encounter (HOSPITAL_COMMUNITY): Payer: Self-pay | Admitting: Gastroenterology

## 2015-08-16 ENCOUNTER — Telehealth: Payer: Self-pay | Admitting: Gastroenterology

## 2015-08-16 NOTE — Telephone Encounter (Signed)
Please inform patient path consistent with benign FUNDIC GLAND POLYPS (X3).- NEGATIVE FOR DYSPLASIA. - NEGATIVE FOR H. PYLORI. Thanks

## 2015-08-16 NOTE — Telephone Encounter (Signed)
Dr Silverio Decamp have you reviewed the path result from 08/08/15?

## 2015-08-16 NOTE — Telephone Encounter (Signed)
Pt has been notified and will follow up as needed.

## 2015-08-28 ENCOUNTER — Encounter: Payer: Self-pay | Admitting: Gastroenterology

## 2015-11-16 NOTE — Telephone Encounter (Signed)
See Previous Encounter

## 2015-12-06 ENCOUNTER — Other Ambulatory Visit: Payer: Self-pay

## 2015-12-06 DIAGNOSIS — R945 Abnormal results of liver function studies: Secondary | ICD-10-CM

## 2015-12-25 ENCOUNTER — Other Ambulatory Visit (INDEPENDENT_AMBULATORY_CARE_PROVIDER_SITE_OTHER): Payer: BLUE CROSS/BLUE SHIELD

## 2015-12-25 DIAGNOSIS — R945 Abnormal results of liver function studies: Secondary | ICD-10-CM

## 2015-12-25 DIAGNOSIS — K7689 Other specified diseases of liver: Secondary | ICD-10-CM

## 2015-12-25 LAB — HEPATIC FUNCTION PANEL
ALT: 18 U/L (ref 0–35)
AST: 16 U/L (ref 0–37)
Albumin: 4 g/dL (ref 3.5–5.2)
Alkaline Phosphatase: 130 U/L — ABNORMAL HIGH (ref 39–117)
BILIRUBIN DIRECT: 0.1 mg/dL (ref 0.0–0.3)
TOTAL PROTEIN: 7.1 g/dL (ref 6.0–8.3)
Total Bilirubin: 0.3 mg/dL (ref 0.2–1.2)

## 2016-04-25 ENCOUNTER — Encounter: Payer: Self-pay | Admitting: Gastroenterology

## 2017-04-10 ENCOUNTER — Other Ambulatory Visit: Payer: Self-pay | Admitting: Physician Assistant

## 2017-04-10 DIAGNOSIS — R1012 Left upper quadrant pain: Secondary | ICD-10-CM

## 2017-05-06 ENCOUNTER — Encounter (INDEPENDENT_AMBULATORY_CARE_PROVIDER_SITE_OTHER): Payer: Self-pay

## 2017-05-06 ENCOUNTER — Ambulatory Visit (INDEPENDENT_AMBULATORY_CARE_PROVIDER_SITE_OTHER): Payer: BLUE CROSS/BLUE SHIELD | Admitting: Physician Assistant

## 2017-05-06 ENCOUNTER — Other Ambulatory Visit: Payer: Self-pay | Admitting: Emergency Medicine

## 2017-05-06 ENCOUNTER — Encounter: Payer: Self-pay | Admitting: Physician Assistant

## 2017-05-06 ENCOUNTER — Encounter: Payer: Self-pay | Admitting: Gastroenterology

## 2017-05-06 VITALS — BP 118/80 | HR 100 | Ht 64.0 in | Wt 206.0 lb

## 2017-05-06 DIAGNOSIS — R1012 Left upper quadrant pain: Secondary | ICD-10-CM

## 2017-05-06 DIAGNOSIS — Z1211 Encounter for screening for malignant neoplasm of colon: Secondary | ICD-10-CM

## 2017-05-06 DIAGNOSIS — R935 Abnormal findings on diagnostic imaging of other abdominal regions, including retroperitoneum: Secondary | ICD-10-CM | POA: Diagnosis not present

## 2017-05-06 DIAGNOSIS — G8929 Other chronic pain: Secondary | ICD-10-CM | POA: Diagnosis not present

## 2017-05-06 DIAGNOSIS — K219 Gastro-esophageal reflux disease without esophagitis: Secondary | ICD-10-CM

## 2017-05-06 DIAGNOSIS — R933 Abnormal findings on diagnostic imaging of other parts of digestive tract: Secondary | ICD-10-CM

## 2017-05-06 MED ORDER — OMEPRAZOLE 40 MG PO CPDR: 40.0000 mg | DELAYED_RELEASE_CAPSULE | Freq: Two times a day (BID) | ORAL | 3 refills | Status: DC

## 2017-05-06 MED ORDER — NA SULFATE-K SULFATE-MG SULF 17.5-3.13-1.6 GM/177ML PO SOLN: 1.0000 | ORAL | 0 refills | Status: DC

## 2017-05-06 NOTE — Progress Notes (Signed)
Chief Complaint: Chronic left upper quadrant abdominal pain, abnormal MRI  HPI:  Denise [name] is a 51 year old Caucasian female with a past medical history as listed below including abnormal MRI of her pancreas and chronic left upper quadrant abdominal pain, who was referred to me by Denise Elk, MD for a complaint of chronic left upper quadrant abdominal pain and previous abnormal MRI of her pancreas.    Patient typically follows with Dr. Silverio Decamp and was last seen in clinic 07/31/15 by Denise Ba, PA-C. At that time previous MRI findings were discussed from 2016 including a nonenhancing cystic lesion in the distal pancreatic body and then at least 5 other similar subcentimeter scattered cysts. 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 At that time patient also continued to complain of left upper quadrant pain. Her Prilosec was increased to 40 mg twice a day for a month and she was scheduled for an EGD. There was also plan to repeat MRI in 1 year.    EGD 08/08/15 revealed fundic gland polyps and was otherwise normal.    Per chart review patient recently saw her PCP and had labs including CBC, CMP and lipase on 04/08/17 which were normal. It was recommended she follow with our clinic regarding follow-up MRI of pancreatic abnormality as well as her chronic left upper quadrant pain.    Today, the patient tells me that currently she does not have left upper quadrant pain. In fact, this has been somewhat better over the past few months. She has noticed a correlation between either eating fatty/spicy foods and/or gas buildup before bowel movement. Typically, if the patient has a bowel movement this pain is relieved afterwards. She is not concerned regarding this pain at this time.   Patient does describe trouble getting her insurance to pay for MRI and was unsure why she needed to repeat this. Patient denies having had a previous  colonoscopy.   Patient denies fever, chills, blood in her stool, melena, weight loss, fatigue, anorexia, change in bowel habits, nausea, vomiting or symptoms that awaken her at night.   Past Medical History:  Diagnosis Date  . Elevated lipase   . Elevated liver enzymes   . Fibroid    currently and previously  . Gallstones   . GERD (gastroesophageal reflux disease)    takes prilosec   . Hypertension   . Ovarian cyst    left ovary  . Pancreatic cyst     Past Surgical History:  Procedure Laterality Date  . ABDOMINAL HYSTERECTOMY    . APPENDECTOMY    . CESAREAN SECTION     x 1   . CHOLECYSTECTOMY    . DILATION AND CURETTAGE OF UTERUS     missed AB x2   . DILITATION & CURRETTAGE/HYSTROSCOPY WITH HYDROTHERMAL ABLATION N/A 02/01/2015   Procedure: HYSTEROSCOPY WITH HYDROTHERMAL ABLATION;  Surgeon: Dian Queen, MD;  Location: [facility];  Service: Gynecology;  Laterality: N/A;  . ESOPHAGOGASTRODUODENOSCOPY (EGD) WITH PROPOFOL N/A 08/08/2015   Procedure: ESOPHAGOGASTRODUODENOSCOPY (EGD) WITH PROPOFOL;  Surgeon: Mauri Pole, MD;  Location: [facility];  Service: Endoscopy;  Laterality: N/A;  . REFRACTIVE SURGERY Bilateral     Current Outpatient Prescriptions  Medication Sig Dispense Refill  . acetaminophen (TYLENOL) 500 MG tablet Take 500 mg by mouth daily as needed for headache.    . docusate sodium (COLACE) 100 MG capsule Take 100 mg by mouth daily.    Marland Kitchen ibuprofen (ADVIL,MOTRIN) 600 MG  tablet Take 600 mg by mouth every 6 (six) hours as needed (pain).     Marland Kitchen lisinopril (PRINIVIL,ZESTRIL) 20 MG tablet Take 20 mg by mouth daily.    . metoprolol succinate (TOPROL-XL) 25 MG 24 hr tablet Take 25 mg by mouth daily.    Marland Kitchen omeprazole (PRILOSEC) 40 MG capsule Take 1 capsule (40 mg total) by mouth 2 (two) times daily. (Patient taking differently: Take 40 mg by mouth daily after breakfast. ) 60 capsule 3  . traMADol (ULTRAM) 50 MG tablet Take 1 tablet (50 mg total) by mouth every 6 (six)  hours as needed. 40 tablet 0  . valACYclovir (VALTREX) 1000 MG tablet Take 1,000 mg by mouth See admin instructions. Take 1 tablet (1000 mg) by mouth twice daily for 3 days as needed for fever blisters     No current facility-administered medications for this visit.     Allergies as of 05/06/2017 - Review Complete 05/06/2017  Allergen Reaction Noted  . Latex Hives 01/12/2015  . Sulfa antibiotics Hives 03/25/2014    Family History  Problem Relation Age of Onset  . Hyperlipidemia Mother   . Hypertension Mother   . Diabetes Father   . Hyperlipidemia Brother   . Hypertension Brother   . Lung cancer Maternal Grandfather        smoker  . Lung cancer Paternal Uncle        smoker  . Colon cancer Maternal Uncle     Social History   Social History  . Marital status: Married    Spouse name: N/A  . Number of children: 1  . Years of education: N/A   Occupational History  . sales    Social History Main Topics  . Smoking status: Never Smoker  . Smokeless tobacco: Never Used  . Alcohol use 0.0 oz/week     Comment: social  . Drug use: No  . Sexual activity: Not on file   Other Topics Concern  . Not on file   Social History Narrative  . No narrative on file    Review of Systems:    Constitutional: No weight loss, fever or chills Cardiovascular: No chest pain  Respiratory: No SOB Gastrointestinal: See HPI and otherwise negative   Physical Exam:  Vital signs: BP 118/80   Pulse 100   Ht 5\' 4"  (1.626 m)   Wt 206 lb (93.4 kg)   LMP 03/25/2014   BMI 35.36 kg/m   Constitutional:   Pleasant overweight Caucasian female appears to be in NAD, Well developed, Well nourished, alert and cooperative Respiratory: Respirations even and unlabored. Lungs clear to auscultation bilaterally.   No wheezes, crackles, or rhonchi.  Cardiovascular: Normal S1, S2. No MRG. Regular rate and rhythm. No peripheral edema, cyanosis or pallor.  Gastrointestinal:  Soft, nondistended, mild LUQ ttp No  rebound or guarding. Normal bowel sounds. No appreciable masses or hepatomegaly. Psychiatric:  Demonstrates good judgement and reason without abnormal affect or behaviors.  See HPI for recent labs.  IMAGING: EXAM: MRI ABDOMEN WITHOUT AND WITH CONTRAST 07/25/15  TECHNIQUE: Multiplanar multisequence MR imaging of the abdomen was performed both before and after the administration of intravenous contrast.  CONTRAST:15 cc MultiHance IV.  COMPARISON:07/18/2015 CT abdomen/pelvis.  FINDINGS: Lower chest: Clear lung bases.  Hepatobiliary: Normal liver size and configuration. Mild diffuse hepatic steatosis on chemical shift imaging. There are small arterial phase foci of hyper enhancement in the segment 6 inferior right liver lobe (series 14/image 50) and subcapsular posterior segment 2 left liver  lobe (14/10), which are occult on all other sequences, in keeping with transient hepatic perfusion phenomena. No liver mass. Status post cholecystectomy. No biliary ductal dilatation. Common bile duct diameter 5 mm. No choledocholithiasis. No biliary stricture or enhancing biliary mass.  Pancreas: No pancreas divisum. There is a 1.1 x 0.9 x 1.0 cm unilocular nonenhancing cystic structure in the distal pancreatic body (series 2/image 9). There are at least 5 additional scattered subcentimeter unilocular nonenhancing cystic pancreatic lesions scattered throughout the pancreas. No main pancreatic duct dilation. The background pancreatic parenchymal signal intensity is within normal limits, with no evidence of chronic pancreatitis.  Spleen: Normal size. No mass.  Adrenals/Urinary Tract: Normal adrenals. No hydronephrosis. There is a 1.0 cm hemorrhagic/proteinaceous nonenhancing renal cyst in the posterior upper left kidney, which demonstrates precontrast T1 isointensity, in keeping with a Bosniak category 2 renal cyst. There is a simple 0.8 cm renal cyst in the lower left  kidney.  Stomach/Bowel: Grossly normal stomach. Visualized small and large bowel is normal caliber, with no bowel wall thickening.  Vascular/Lymphatic: Normal caliber abdominal aorta. Patent portal, splenic, hepatic and renal veins. No pathologically enlarged lymph nodes in the abdomen.  Other: No abdominal ascites or focal fluid collection.  Musculoskeletal: No aggressive appearing focal osseous lesions.  IMPRESSION: 1. Several nonenhancing unilocular cystic pancreatic lesions, largest 1.1 cm in the distal pancreatic body. No main pancreatic duct dilation. 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 A single follow-up MRI abdomen with and without intravenous contrast is recommended in 12 months. This recommendation follows ACR consensus guidelines: Managing Incidental Findings on Abdominal CT: White Paper of the ACR Incidental Findings Committee. J Am Coll Radiol 2010;7:754-773 2. No abdominal lymphadenopathy. 3. Mild diffuse hepatic steatosis.No liver mass. 4. Status post cholecystectomy, with no biliary ductal dilatation. 5. Small benign Bosniak category 1 and category 2 renal cysts in the left kidney.   Electronically Signed By: Ilona Sorrel M.D. On: 07/25/2015 17:44  Assessment: 1. Chronic left upper quadrant abdominal pain: Consider relation to known reflux versus pancreatic lesions versus splenic flexure syndrome 2. GERD: Moderately controlled on Omeprazole 40 mg daily 3. Abnormal pancreatic imaging: In 2016 with recommendations for repeat in 2 years due to abnormal pancreatic lesions as above 4. Screening colonoscopy  Plan: 1. Reviewed patient's last MRI with her today. Discussed that we would like to reimage to ensure that these lesions have not changed and do not represent cancer. She will proceed with MRI with pancreatic protocol at [facility]. 2. Increased patient's Omeprazole to 40 mg  twice a day for one month to see if this helps with left upper quadrant pain 3. Discussed relation of chronic left upper quadrant pain to possible gas and splenic flexure syndrome 4. Scheduled patient for a colonoscopy in [place] with Dr. Silverio Decamp for screening. Did discuss risks, benefits, limitations and alternatives and the patient agrees to proceed. 5. Patient to follow in clinic per recommendations after imaging and colonoscopy above.  [name], PA-C [facility] 05/06/2017, 10:32 AM  Cc: Denise Elk, MD

## 2017-05-06 NOTE — Patient Instructions (Addendum)
  We will contact you about you MRI appointment.    We have sent the following medications to your pharmacy for you to pick up at your convenience: Omeprazole 40 mg twice a day. 30-60 mins before breakfast and dinner  You have been scheduled for a colonoscopy. Please follow written instructions given to you at your visit today.  Please pick up your prep supplies at the pharmacy within the next 1-3 days. If you use inhalers (even only as needed), please bring them with you on the day of your procedure. Your physician has requested that you go to www.startemmi.com and enter the access code given to you at your visit today. This web site gives a general overview about your procedure. However, you should still follow specific instructions given to you by our office regarding your preparation for the procedure.

## 2017-05-08 NOTE — Progress Notes (Signed)
Reviewed and agree with documentation and assessment and plan. K. Veena Elnora Quizon , MD   

## 2017-05-11 ENCOUNTER — Encounter: Payer: Self-pay | Admitting: Gastroenterology

## 2017-05-11 ENCOUNTER — Ambulatory Visit (AMBULATORY_SURGERY_CENTER): Payer: BLUE CROSS/BLUE SHIELD | Admitting: Gastroenterology

## 2017-05-11 VITALS — BP 149/88 | HR 80 | Temp 98.0°F | Resp 20 | Ht 64.0 in | Wt 206.0 lb

## 2017-05-11 DIAGNOSIS — Z1211 Encounter for screening for malignant neoplasm of colon: Secondary | ICD-10-CM

## 2017-05-11 DIAGNOSIS — Z1212 Encounter for screening for malignant neoplasm of rectum: Secondary | ICD-10-CM

## 2017-05-11 MED ORDER — SODIUM CHLORIDE 0.9 % IV SOLN: 500.0000 mL | INTRAVENOUS | Status: DC

## 2017-05-11 NOTE — Progress Notes (Signed)
Pt's states no medical or surgical changes since previsit or office visit. 

## 2017-05-11 NOTE — Patient Instructions (Signed)
YOU HAD AN ENDOSCOPIC PROCEDURE TODAY AT [facility]:   Refer to the procedure report that was given to you for any specific questions about what was found during the examination.  If the procedure report does not answer your questions, please call your gastroenterologist to clarify.  If you requested that your care partner not be given the details of your procedure findings, then the procedure report has been included in a sealed envelope for you to review at your convenience later.  YOU SHOULD EXPECT: Some feelings of bloating in the abdomen. Passage of more gas than usual.  Walking can help get rid of the air that was put into your GI tract during the procedure and reduce the bloating. If you had a lower endoscopy (such as a colonoscopy or flexible sigmoidoscopy) you may notice spotting of blood in your stool or on the toilet paper. If you underwent a bowel prep for your procedure, you may not have a normal bowel movement for a few days.  Please Note:  You might notice some irritation and congestion in your nose or some drainage.  This is from the oxygen used during your procedure.  There is no need for concern and it should clear up in a day or so.  SYMPTOMS TO REPORT IMMEDIATELY:   Following lower endoscopy (colonoscopy or flexible sigmoidoscopy):  Excessive amounts of blood in the stool  Significant tenderness or worsening of abdominal pains  Swelling of the abdomen that is new, acute  Fever of 100F or higher  For urgent or emergent issues, a gastroenterologist can be reached at any hour by calling [phone].   DIET:  We do recommend a small meal at first, but then you may proceed to your regular diet.  Drink plenty of fluids but you should avoid alcoholic beverages for 24 hours.  ACTIVITY:  You should plan to take it easy for the rest of today and you should NOT DRIVE or use heavy machinery until tomorrow (because of the sedation medicines used during the test).     FOLLOW UP: Our staff will call the number listed on your records the next business day following your procedure to check on you and address any questions or concerns that you may have regarding the information given to you following your procedure. If we do not reach you, we will leave a message.  However, if you are feeling well and you are not experiencing any problems, there is no need to return our call.  We will assume that you have returned to your regular daily activities without incident.  If any biopsies were taken you will be contacted by phone or by letter within the next 1-3 weeks.  Please call us at [phone] if you have not heard about the biopsies in 3 weeks.   Repeat Colonoscopy screening in 10 years .Marland KitchenNo ibuprofen, naproxen or other non-steroidal anti-inflammatory drugs for 2 weeks after polyp removal. Tylenol okay if needed. November 27,2018 at 9:15 appointment with Dr. Silverio Decamp  SIGNATURES/CONFIDENTIALITY: You and/or your care partner have signed paperwork which will be entered into your electronic medical record.  These signatures attest to the fact that that the information above on your After Visit Summary has been reviewed and is understood.  Full responsibility of the confidentiality of this discharge information lies with you and/or your care-partner.

## 2017-05-11 NOTE — Progress Notes (Signed)
Report given to PACU, vss 

## 2017-05-11 NOTE — Op Note (Signed)
[place] Patient Name: Denise [name] Procedure Date: 05/11/2017 3:26 PM MRN: [id number] Endoscopist: Mauri Pole , MD Age: 51 Referring MD:  Date of Birth: 09/07/1965 Gender: Female Account #: [id number] Procedure:                Colonoscopy Indications:              Screening for colorectal malignant neoplasm Medicines:                Monitored Anesthesia Care Procedure:                Pre-Anesthesia Assessment:                           - Prior to the procedure, a History and Physical                            was performed, and patient medications and                            allergies were reviewed. The patient's tolerance of                            previous anesthesia was also reviewed. The risks                            and benefits of the procedure and the sedation                            options and risks were discussed with the patient.                            All questions were answered, and informed consent                            was obtained. Prior Anticoagulants: The patient has                            taken no previous anticoagulant or antiplatelet                            agents. ASA Grade Assessment: II - A patient with                            mild systemic disease. After reviewing the risks                            and benefits, the patient was deemed in                            satisfactory condition to undergo the procedure.                           After obtaining informed consent, the colonoscope  was passed under direct vision. Throughout the                            procedure, the patient's blood pressure, pulse, and                            oxygen saturations were monitored continuously. The                            Model PCF-[id number]) scope was introduced                            through the anus and advanced to the the terminal                            ileum, with  identification of the appendiceal                            orifice and IC valve. The colonoscopy was performed                            without difficulty. The patient tolerated the                            procedure well. The quality of the bowel                            preparation was excellent. The terminal ileum,                            ileocecal valve, appendiceal orifice, and rectum                            were photographed. Scope In: 3:37:06 PM Scope Out: 3:57:58 PM Scope Withdrawal Time: 0 hours 10 minutes 5 seconds  Total Procedure Duration: 0 hours 20 minutes 52 seconds  Findings:                 The perianal and digital rectal examinations were                            normal.                           A single (solitary) five mm ulcer was found at the                            ileocecal valve. No bleeding was present. No                            stigmata of recent bleeding were seen.                           The terminal ileum appeared normal.  The entire examined colon appeared normal on direct                            and retroflexion views. Complications:            No immediate complications. Estimated Blood Loss:     Estimated blood loss: none. Impression:               - A single (solitary) ulcer at the ileocecal valve.                           - The examined portion of the ileum was normal.                           - The entire examined colon is normal on direct and                            retroflexion views.                           - No specimens collected. Recommendation:           - Patient has a contact number available for                            emergencies. The signs and symptoms of potential                            delayed complications were discussed with the                            patient. Return to normal activities tomorrow.                            Written discharge instructions were  provided to the                            patient.                           - Resume previous diet.                           - Continue present medications.                           - Repeat colonoscopy in 10 years for screening                            purposes.                           - Return to GI clinic at the next available                            appointment for evaluation of left upper quadrant  abdominal pain.                           - No aspirin, ibuprofen, naproxen, or other                            non-steroidal anti-inflammatory drugs. Mauri Pole, MD 05/11/2017 4:02:27 PM This report has been signed electronically.

## 2017-05-12 ENCOUNTER — Telehealth: Payer: Self-pay | Admitting: *Deleted

## 2017-05-12 NOTE — Telephone Encounter (Signed)
  Follow up Call-  Call back number 05/11/2017  Post procedure Call Back phone  # [phone]  Permission to leave phone message Yes  Some recent data might be hidden     Patient questions:  Do you have a fever, pain , or abdominal swelling? No. Pain Score  0 *  Have you tolerated food without any problems? Yes.    Have you been able to return to your normal activities? Yes.    Do you have any questions about your discharge instructions: Diet   No. Medications  No. Follow up visit  No.  Do you have questions or concerns about your Care? Yes.    Actions: * If pain score is 4 or above: No action needed, pain <4. Patient questioned procedure results. Patient's picture report and all documents are in the car which her husband took to [place] last evening. Patient questioned the finding of ulcer and return office appointment. Discussed both of questions to patient's satisfication.

## 2017-05-13 ENCOUNTER — Telehealth: Payer: Self-pay | Admitting: Gastroenterology

## 2017-05-13 NOTE — Telephone Encounter (Signed)
Patient states she would like a call from nurse Beth to discuss colon done on 05/11/17.

## 2017-05-13 NOTE — Telephone Encounter (Signed)
Spoke with the patient. She had a few questions about her procedure that the nurse from [facility] was not able to answer. She is advised there was a single ulcer at the ileocecal valve. No polyps.  Reviewed the date of her appointment.

## 2017-06-26 ENCOUNTER — Ambulatory Visit (INDEPENDENT_AMBULATORY_CARE_PROVIDER_SITE_OTHER): Payer: BLUE CROSS/BLUE SHIELD | Admitting: Gastroenterology

## 2017-06-26 ENCOUNTER — Encounter: Payer: Self-pay | Admitting: Gastroenterology

## 2017-06-26 DIAGNOSIS — R1012 Left upper quadrant pain: Secondary | ICD-10-CM

## 2017-06-26 NOTE — Progress Notes (Signed)
Denise [name]    [id number]    Nov 23, 1965  Primary Care Physician:[name], [name], MD  Referring Physician: [name], [place], [place] [address]  Chief complaint:  LUQ abdominal pain   HPI:  51 year old female here for follow-up visit with complaints of persistent left upper quadrant abdominal pain.  She was last seen by [name] May 06, 2017.  She has a cystic lesion in the distal pancreatic body possible pseudocyst versus side branch I PMN based on imaging in 2016.  Patient was recommended to have repeat MRI in 1 year, but she did not schedule as she was tired of multiple testing and out-of-pocket expense.  She has constant pressure and discomfort in the left upper quadrant, sometimes worse after a meal.  EGD December 2016 showed multiple fundic gland polyps otherwise unremarkable.  Colonoscopy September 2018 noted solitary erosion and IC valve , patient was taking NSAIDs regularly at that time and has since stopped taking them.  Denies any nausea, vomiting, abdominal pain, melena or bright red blood per rectum    Outpatient Encounter Medications as of 06/26/2017  Medication Sig  . Cholecalciferol (VITAMIN D3) 2000 units capsule Take 1 capsule daily by mouth.  . cyclobenzaprine (FLEXERIL) 10 MG tablet Take 1 tablet as needed by mouth.  . docusate sodium (COLACE) 100 MG capsule Take 100 mg by mouth daily.  . hydrochlorothiazide (HYDRODIURIL) 50 MG tablet Take 1 tablet daily by mouth.  Marland Kitchen lisinopril (PRINIVIL,ZESTRIL) 20 MG tablet Take 20 mg by mouth daily.  Marland Kitchen loratadine (CLARITIN) 10 MG tablet Take 10 mg daily by mouth.  . metoprolol succinate (TOPROL-XL) 25 MG 24 hr tablet Take 25 mg by mouth daily.  . Multiple Vitamin (MULTI-VITAMINS) TABS Take 1 tablet daily by mouth.  Marland Kitchen omeprazole (PRILOSEC) 40 MG capsule Take 1 capsule (40 mg total) by mouth 2 (two) times daily before a meal.  . valACYclovir  (VALTREX) 1000 MG tablet Take 1,000 mg by mouth See admin instructions. Take 1 tablet (1000 mg) by mouth twice daily for 3 days as needed for fever blisters  . vitamin B-12 (CYANOCOBALAMIN) 1000 MCG tablet Take 1 tablet daily by mouth.  . [DISCONTINUED] acetaminophen (TYLENOL) 500 MG tablet Take 500 mg by mouth daily as needed for headache.  . [DISCONTINUED] ibuprofen (ADVIL,MOTRIN) 600 MG tablet Take 600 mg by mouth every 6 (six) hours as needed (pain).   . [DISCONTINUED] traMADol (ULTRAM) 50 MG tablet Take 1 tablet (50 mg total) by mouth every 6 (six) hours as needed.   No facility-administered encounter medications on file as of 06/26/2017.     Allergies as of 06/26/2017 - Review Complete 06/26/2017  Allergen Reaction Noted  . Latex Hives 01/12/2015  . Sulfa antibiotics Hives 03/25/2014    Past Medical History:  Diagnosis Date  . Elevated lipase   . Elevated liver enzymes   . Fibroid    currently and previously  . Gallstones   . GERD (gastroesophageal reflux disease)    takes prilosec   . Hypertension   . Ovarian cyst    left ovary  . Pancreatic cyst     Past Surgical History:  Procedure Laterality Date  . ABDOMINAL HYSTERECTOMY    . APPENDECTOMY    . CESAREAN SECTION     x 1   . CHOLECYSTECTOMY    . DILATION AND CURETTAGE OF UTERUS     missed AB x2   .  REFRACTIVE SURGERY Bilateral     Family History  Problem Relation Age of Onset  . Hyperlipidemia Mother   . Hypertension Mother   . Diabetes Father   . Hyperlipidemia Brother   . Hypertension Brother   . Lung cancer Maternal Grandfather        smoker  . Lung cancer Paternal Uncle        smoker  . Colon cancer Maternal Uncle     Social History   Socioeconomic History  . Marital status: Married    Spouse name: Not on file  . Number of children: 1  . Years of education: Not on file  . Highest education level: Not on file  Social Needs  . Financial resource strain: Not on file  . Food insecurity - worry:  Not on file  . Food insecurity - inability: Not on file  . Transportation needs - medical: Not on file  . Transportation needs - non-medical: Not on file  Occupational History  . Occupation: Press photographer  Tobacco Use  . Smoking status: Never Smoker  . Smokeless tobacco: Never Used  Substance and Sexual Activity  . Alcohol use: Yes    Alcohol/week: 0.0 oz    Comment: social  . Drug use: No  . Sexual activity: Not on file  Other Topics Concern  . Not on file  Social History Narrative  . Not on file      Review of systems: Review of Systems  Constitutional: Negative for fever and chills.  HENT: Negative.   Eyes: Negative for blurred vision.  Respiratory: Negative for cough, shortness of breath and wheezing.   Cardiovascular: Negative for chest pain and palpitations.  Gastrointestinal: as per HPI Genitourinary: Negative for dysuria, urgency, frequency and hematuria.  Musculoskeletal: Negative for myalgias, back pain and joint pain.  Skin: Negative for itching and rash.  Neurological: Negative for dizziness, tremors, focal weakness, seizures and loss of consciousness.  Endo/Heme/Allergies: Positive for seasonal allergies.  Psychiatric/Behavioral: Negative for depression, suicidal ideas and hallucinations.  All other systems reviewed and are negative.   Physical Exam: Vitals:   06/26/17 1520  BP: 114/80  Pulse: 92   Body mass index is 35.96 kg/m. Gen:      No acute distress HEENT:  EOMI, sclera anicteric Neck:     No masses; no thyromegaly Lungs:    Clear to auscultation bilaterally; normal respiratory effort CV:         Regular rate and rhythm; no murmurs Abd:      + bowel sounds; soft, non-tender; ? palpable spleen, no distension Ext:    No edema; adequate peripheral perfusion Skin:      Warm and dry; no rash Neuro: alert and oriented x 3 Psych: normal mood and affect  Data Reviewed:  Reviewed labs, radiology imaging, old records and pertinent past GI work  up   Assessment and Plan/Recommendations:  51 year old female with history of pancreatic cysts here with complaints of persistent left upper quadrant abdominal pain On exam patient has slightly enlarged spleen, is palpable below rib cage Will obtain CT pancreatic protocol abdomen with contrast to further evaluate pancreatic cyst and also ?  Enlarged spleen We will check CMP, lipase, triglyceride and cholesterol panel Return in 1 month or sooner if needed  25 minutes was spent face-to-face with the patient. Greater than 50% of the time used for counseling as well as treatment plan and follow-up. She had multiple questions which were answered to her satisfaction  K. Denzil Magnuson ,  MD [phone] Mon-Fri 8a-5p [phone] after 5p, weekends, holidays  CC: [name], MD

## 2017-06-26 NOTE — Patient Instructions (Signed)
Come back on Monday November 12th for your fasting labs     You have been scheduled for a CT scan of the abdomen and pelvis at [facility] ([address]---this is in the same building as Press [employer]).   You are scheduled on 06/30/2017 at 3:30pm. You should arrive 15 minutes prior to your appointment time for registration. Please follow the written instructions below on the day of your exam:  WARNING: IF YOU ARE ALLERGIC TO IODINE/X-RAY DYE, PLEASE NOTIFY RADIOLOGY IMMEDIATELY AT [phone]! YOU WILL BE GIVEN A 13 HOUR PREMEDICATION PREP.  1) Do not eat or drink anything after 11:30am (4 hours prior to your test) 2) You have been given 2 bottles of oral contrast to drink. The solution may taste               better if refrigerated, but do NOT add ice or any other liquid to this solution. Shake             well before drinking.    Drink 1 bottle of contrast @ 1:30pm (2 hours prior to your exam)  Drink 1 bottle of contrast @ 2:30pm (1 hour prior to your exam)  You may take any medications as prescribed with a small amount of water except for the following: Metformin, Glucophage, Glucovance, Avandamet, Riomet, Fortamet, Actoplus Met, Janumet, Glumetza or Metaglip. The above medications must be held the day of the exam AND 48 hours after the exam.  The purpose of you drinking the oral contrast is to aid in the visualization of your intestinal tract. The contrast solution may cause some diarrhea. Before your exam is started, you will be given a small amount of fluid to drink. Depending on your individual set of symptoms, you may also receive an intravenous injection of x-ray contrast/dye. Plan on being at [facility] for 30 minutes or longer, depending on the type of exam you are having performed.  This test typically takes 30-45 minutes to complete.  If you have any questions regarding your exam or if you need to reschedule, you may call the CT department at  [phone] between the hours of 8:00 am and 5:00 pm, Monday-Friday.  ________________________________________________________________________

## 2017-06-29 ENCOUNTER — Other Ambulatory Visit (INDEPENDENT_AMBULATORY_CARE_PROVIDER_SITE_OTHER): Payer: BLUE CROSS/BLUE SHIELD

## 2017-06-29 DIAGNOSIS — R1012 Left upper quadrant pain: Secondary | ICD-10-CM | POA: Diagnosis not present

## 2017-06-29 LAB — COMPREHENSIVE METABOLIC PANEL
ALT: 27 U/L (ref 0–35)
AST: 23 U/L (ref 0–37)
Albumin: 4.1 g/dL (ref 3.5–5.2)
Alkaline Phosphatase: 86 U/L (ref 39–117)
BUN: 16 mg/dL (ref 6–23)
CO2: 30 mEq/L (ref 19–32)
Calcium: 9.7 mg/dL (ref 8.4–10.5)
Chloride: 98 mEq/L (ref 96–112)
Creatinine, Ser: 0.83 mg/dL (ref 0.40–1.20)
GFR: 76.87 mL/min (ref >60.00)
GLUCOSE: 156 mg/dL — AB (ref 70–99)
Potassium: 3.8 mEq/L (ref 3.5–5.1)
SODIUM: 138 meq/L (ref 135–145)
Total Bilirubin: 0.4 mg/dL (ref 0.2–1.2)
Total Protein: 7.3 g/dL (ref 6.0–8.3)

## 2017-06-29 LAB — LIPID PANEL
CHOL/HDL RATIO: 8
CHOLESTEROL: 283 mg/dL — AB (ref 0–200)
HDL: 34.2 mg/dL — AB (ref >39.00)

## 2017-06-29 LAB — LDL CHOLESTEROL, DIRECT: Direct LDL: 157 mg/dL

## 2017-06-29 LAB — LIPASE: LIPASE: 47 U/L (ref 11.0–59.0)

## 2017-07-01 ENCOUNTER — Ambulatory Visit (INDEPENDENT_AMBULATORY_CARE_PROVIDER_SITE_OTHER)
Admission: RE | Admit: 2017-07-01 | Discharge: 2017-07-01 | Disposition: A | Discharge status: Home / Self Care | Payer: BLUE CROSS/BLUE SHIELD | Source: Ambulatory Visit | Attending: Gastroenterology | Admitting: Gastroenterology

## 2017-07-01 DIAGNOSIS — R1012 Left upper quadrant pain: Secondary | ICD-10-CM

## 2017-07-01 MED ORDER — IOPAMIDOL (ISOVUE-300) INJECTION 61%
100.0000 mL | Freq: Once | INTRAVENOUS | Status: AC | PRN
  Administered 2017-07-01: 100 mL via INTRAVENOUS

## 2017-07-02 ENCOUNTER — Telehealth: Payer: Self-pay | Admitting: Gastroenterology

## 2017-07-02 NOTE — Telephone Encounter (Signed)
What would you like me to tell her at this point?

## 2017-07-02 NOTE — Telephone Encounter (Signed)
I called patient and discussed results, informed her that I am waiting for response back from Dr Ardis Hughs to proceed with MRI vs EUS? She agrees to proceed with either as recommended and requesting [place] to go ahead and schedule them. She is going to be in meeting tomorrow until [facility]

## 2017-07-06 ENCOUNTER — Telehealth: Payer: Self-pay | Admitting: Gastroenterology

## 2017-07-06 ENCOUNTER — Other Ambulatory Visit: Payer: Self-pay

## 2017-07-06 DIAGNOSIS — K862 Cyst of pancreas: Secondary | ICD-10-CM

## 2017-07-06 NOTE — Telephone Encounter (Signed)
Please send Rx for Tramadol 50mg  BID as needed X 62month for abdominal pain. The pancreatic cyst is unlikely to be causing the pain. She will need to contact PMD for the neck swelling. Thanks

## 2017-07-06 NOTE — Telephone Encounter (Signed)
Called the patient to discuss getting the MRI scheduled. She will get back with me regarding where she will have this done. She asks what should she do about her pain of the abdomen in the interim. She has a pulsatile area on her neck that will swell or increase in size with increased activity. She has noted she has her abdominal pain described as a "stinging" when she has this area in her neck swelling also. She calls the swelling in her neck a "goiter." This area has been investigated by " a lot of specialists" and she has not had a diagnosis given to her for it yet. Concerned and asking a lot of questions.

## 2017-07-06 NOTE — Telephone Encounter (Signed)
Advised Ms. Bischoff of this. She declines Tramadol. She will get back with me on where she would want to have her MRI performed.

## 2017-07-14 ENCOUNTER — Ambulatory Visit: Payer: BLUE CROSS/BLUE SHIELD | Admitting: Gastroenterology

## 2017-07-16 NOTE — Telephone Encounter (Signed)
Patient contacted. She states she will call me back about scheduling the MRI.

## 2017-11-02 ENCOUNTER — Telehealth: Payer: Self-pay | Admitting: Gastroenterology

## 2017-11-02 NOTE — Telephone Encounter (Signed)
She is an established patient last seen in November of 2018. She was to have come back in 1 month for follow up with Dr Silverio Decamp. She has known pancreatic pseudocysts.Okay to schedule for the next available appointment with Dr Silverio Decamp.  Thank you.

## 2018-01-14 ENCOUNTER — Encounter: Payer: Self-pay | Admitting: Gastroenterology

## 2018-01-14 ENCOUNTER — Encounter (INDEPENDENT_AMBULATORY_CARE_PROVIDER_SITE_OTHER): Payer: Self-pay

## 2018-01-14 ENCOUNTER — Ambulatory Visit (INDEPENDENT_AMBULATORY_CARE_PROVIDER_SITE_OTHER): Payer: BLUE CROSS/BLUE SHIELD | Admitting: Gastroenterology

## 2018-01-14 ENCOUNTER — Other Ambulatory Visit (INDEPENDENT_AMBULATORY_CARE_PROVIDER_SITE_OTHER): Payer: BLUE CROSS/BLUE SHIELD

## 2018-01-14 VITALS — BP 128/80 | HR 86 | Ht 64.5 in | Wt 204.0 lb

## 2018-01-14 DIAGNOSIS — K862 Cyst of pancreas: Secondary | ICD-10-CM | POA: Diagnosis not present

## 2018-01-14 DIAGNOSIS — R1012 Left upper quadrant pain: Secondary | ICD-10-CM | POA: Diagnosis not present

## 2018-01-14 LAB — COMPREHENSIVE METABOLIC PANEL
ALT: 22 U/L (ref 0–35)
AST: 18 U/L (ref 0–37)
Albumin: 4 g/dL (ref 3.5–5.2)
Alkaline Phosphatase: 95 U/L (ref 39–117)
BUN: 11 mg/dL (ref 6–23)
CALCIUM: 9 mg/dL (ref 8.4–10.5)
CO2: 25 meq/L (ref 19–32)
CREATININE: 0.74 mg/dL (ref 0.40–1.20)
Chloride: 101 mEq/L (ref 96–112)
GFR: 87.57 mL/min (ref >60.00)
Glucose, Bld: 270 mg/dL — ABNORMAL HIGH (ref 70–99)
Potassium: 3.8 mEq/L (ref 3.5–5.1)
Sodium: 137 mEq/L (ref 135–145)
Total Bilirubin: 0.3 mg/dL (ref 0.2–1.2)
Total Protein: 7.3 g/dL (ref 6.0–8.3)

## 2018-01-14 LAB — LIPASE: LIPASE: 44 U/L (ref 11.0–59.0)

## 2018-01-14 NOTE — Progress Notes (Signed)
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  She also had multiloculated L ovarian cyst 4.3 X 4.4 cm.  Repeat CT abdomen pelvis November 2018 showed increase in size of the hypodense lesion in tail of pancreas to 2.1 cm.  Patient was recommended to undergo MRI abdomen to further evaluate but she had not scheduled it at that time.  She continues to have left upper quadrant abdominal discomfort with intermittent worsening of the severity of the pain.  Denies any vomiting, dysphagia, change in bowel habits, melena or blood per rectum.  EGD December 2016 showed multiple fundic gland polyps otherwise unremarkable.  Colonoscopy September 2018 noted solitary erosion and IC valve , patient was taking NSAIDs regularly at that time and has since stopped taking them  Outpatient Encounter Medications as of 01/14/2018  Medication Sig  . Cholecalciferol (VITAMIN D3) 2000 units capsule Take 1 capsule daily by mouth.  . cyclobenzaprine (FLEXERIL) 10 MG tablet Take 1 tablet as needed by mouth.  . docusate sodium (COLACE) 100 MG capsule Take 100 mg by mouth daily.  . hydrochlorothiazide (HYDRODIURIL) 50 MG tablet Take 1 tablet daily by mouth.  Marland Kitchen lisinopril (PRINIVIL,ZESTRIL) 20 MG tablet Take 20 mg by mouth daily.  Marland Kitchen loratadine (CLARITIN) 10 MG tablet Take 10 mg daily by mouth.  . metFORMIN (GLUCOPHAGE) 500 MG tablet Take 500 mg by mouth 2 (two) times daily with a meal.  . metoprolol succinate (TOPROL-XL) 25 MG 24 hr tablet Take 25 mg by mouth daily.  . Multiple Vitamin (MULTI-VITAMINS) TABS Take 1 tablet daily by mouth.  Marland Kitchen omeprazole  (PRILOSEC) 40 MG capsule Take 1 capsule (40 mg total) by mouth 2 (two) times daily before a meal.  . valACYclovir (VALTREX) 1000 MG tablet Take 1,000 mg by mouth See admin instructions. Take 1 tablet (1000 mg) by mouth twice daily for 3 days as needed for fever blisters  . vitamin B-12 (CYANOCOBALAMIN) 1000 MCG tablet Take 1 tablet daily by mouth.  . rosuvastatin (CRESTOR) 10 MG tablet    No facility-administered encounter medications on file as of 01/14/2018.     Allergies as of 01/14/2018 - Review Complete 01/14/2018  Allergen Reaction Noted  . Latex Hives 01/12/2015  . Sulfa antibiotics Hives 03/25/2014    Past Medical History:  Diagnosis Date  . Elevated lipase   . Elevated liver enzymes   . Fibroid    currently and previously  . Gallstones   . GERD (gastroesophageal reflux disease)    takes prilosec   . Hypertension   . Ovarian cyst    left ovary  . Pancreatic cyst     Past Surgical History:  Procedure Laterality Date  . ABDOMINAL HYSTERECTOMY    . APPENDECTOMY    . CESAREAN SECTION     x 1   . CHOLECYSTECTOMY    . DILATION AND CURETTAGE OF UTERUS     missed AB x2   . DILITATION & CURRETTAGE/HYSTROSCOPY WITH HYDROTHERMAL ABLATION N/A 02/01/2015   Procedure: HYSTEROSCOPY WITH HYDROTHERMAL ABLATION;  Surgeon: Dian Queen, MD;  Location: [facility];  Service: Gynecology;  Laterality: N/A;  .  ESOPHAGOGASTRODUODENOSCOPY (EGD) WITH PROPOFOL N/A 08/08/2015   Procedure: ESOPHAGOGASTRODUODENOSCOPY (EGD) WITH PROPOFOL;  Surgeon: Mauri Pole, MD;  Location: [facility];  Service: Endoscopy;  Laterality: N/A;  . REFRACTIVE SURGERY Bilateral     Family History  Problem Relation Age of Onset  . Hyperlipidemia Mother   . Hypertension Mother   . Diabetes Father   . Hyperlipidemia Brother   . Hypertension Brother   . Lung cancer Maternal Grandfather        smoker  . Lung cancer Paternal Uncle        smoker  . Colon cancer Maternal Uncle     Social History    Socioeconomic History  . Marital status: Married    Spouse name: Not on file  . Number of children: 1  . Years of education: Not on file  . Highest education level: Not on file  Occupational History  . Occupation: Geographical information systems officer  . Financial resource strain: Not on file  . Food insecurity:    Worry: Not on file    Inability: Not on file  . Transportation needs:    Medical: Not on file    Non-medical: Not on file  Tobacco Use  . Smoking status: Never Smoker  . Smokeless tobacco: Never Used  Substance and Sexual Activity  . Alcohol use: Yes    Alcohol/week: 0.0 oz    Comment: social  . Drug use: No  . Sexual activity: Not on file  Lifestyle  . Physical activity:    Days per week: Not on file    Minutes per session: Not on file  . Stress: Not on file  Relationships  . Social connections:    Talks on phone: Not on file    Gets together: Not on file    Attends religious service: Not on file    Active member of club or organization: Not on file    Attends meetings of clubs or organizations: Not on file    Relationship status: Not on file  . Intimate partner violence:    Fear of current or ex partner: Not on file    Emotionally abused: Not on file    Physically abused: Not on file    Forced sexual activity: Not on file  Other Topics Concern  . Not on file  Social History Narrative  . Not on file      Review of systems: Review of Systems  Constitutional: Negative for fever and chills.  HENT: Negative.   Eyes: Negative for blurred vision.  Respiratory: Negative for cough, shortness of breath and wheezing.   Cardiovascular: Negative for chest pain and palpitations.  Gastrointestinal: as per HPI Genitourinary: Negative for dysuria, urgency, frequency and hematuria.  Musculoskeletal: Negative for myalgias, back pain and joint pain.  Skin: Negative for itching and rash.  Neurological: Negative for dizziness, tremors, focal weakness, seizures and loss of  consciousness.  Endo/Heme/Allergies: Positive for seasonal allergies.  Psychiatric/Behavioral: Negative for depression, suicidal ideas and hallucinations.  All other systems reviewed and are negative.   Physical Exam: Vitals:   01/14/18 0853  BP: 128/80  Pulse: 86   Body mass index is 34.48 kg/m. Gen:      No acute distress HEENT:  EOMI, sclera anicteric Neck:     No masses; no thyromegaly Lungs:    Clear to auscultation bilaterally; normal respiratory effort CV:         Regular rate and rhythm; no murmurs Abd:      +  bowel sounds; soft, non-tender; no palpable masses, no distension Ext:    No edema; adequate peripheral perfusion Skin:      Warm and dry; no rash Neuro: alert and oriented x 3 Psych: normal mood and affect  Data Reviewed:  Reviewed labs, radiology imaging, old records and pertinent past GI work up   Assessment and Plan/Recommendations:  52 year old female with obesity, diabetes and pancreatic tail cyst that has enlarged in size from 10 mm to 21 mm in 2 years based on CT. patient has chronic left upper quadrant and epigastric discomfort She is willing to proceed with MRI abdomen to better characterize the pancreatic cyst and see if it has any further increase in size.  Further recommendations based on MRI findings, may need to consider EUS +/- with FNA Check lipase and CMP Return in 6 months  25 minutes was spent face-to-face with the patient. Greater than 50% of the time used for counseling as well as treatment plan and follow-up. She had multiple questions which were answered to her satisfaction  K. Denzil Magnuson , MD [phone]    CC: [name], MD

## 2018-01-14 NOTE — Patient Instructions (Signed)
You have been scheduled for an MRI/Abdomen Pelvis at 01/21/2018 at 9:30am. Your appointment . Please arrive 15 minutes prior to your appointment time for registration purposes. Please make certain not to have anything to eat or drink 6 hours prior to your test. In addition, if you have any metal in your body, have a pacemaker or defibrillator, please be sure to let your ordering physician know. This test typically takes 45 minutes to 1 hour to complete. Should you need to reschedule, please call [phone] to do so.  Go to the basement for labs today  If you are age 52 or older, your body mass index should be between 23-30. Your Body mass index is 34.48 kg/m. If this is out of the aforementioned range listed, please consider follow up with your Primary Care Provider.  If you are age 67 or younger, your body mass index should be between 19-25. Your Body mass index is 34.48 kg/m. If this is out of the aformentioned range listed, please consider follow up with your Primary Care Provider.

## 2018-01-15 ENCOUNTER — Encounter: Payer: Self-pay | Admitting: Gastroenterology

## 2018-01-21 ENCOUNTER — Ambulatory Visit (HOSPITAL_COMMUNITY): Admission: RE | Admit: 2018-01-21 | Payer: BLUE CROSS/BLUE SHIELD | Source: Ambulatory Visit

## 2018-05-20 ENCOUNTER — Telehealth: Payer: Self-pay | Admitting: Gastroenterology

## 2018-05-20 NOTE — Telephone Encounter (Signed)
Patient needs to reschedule ct scan but wants it done at [facility] [address] [place], [place] [address] [phone]

## 2018-05-26 NOTE — Telephone Encounter (Signed)
It appears this was not routed to anyone.  The patient came in the office today. She is asking to go forward with a CT. She was last seen 01/14/18. An MRI was ordered that she cancelled due to costs. She remembers it was a CT. Please advise.

## 2018-05-26 NOTE — Telephone Encounter (Signed)
Please schedule patient for MRI abdomen to better evaluate the pancreatic lesion. Thanks

## 2018-05-26 NOTE — Telephone Encounter (Signed)
She needs explanation for getting MRI over CT. She will call her insurance about the coverage and preferred facilities.  Looking through her notes, it looks like money is the reason she has not had the recommended follow up.

## 2018-05-26 NOTE — Telephone Encounter (Signed)
MRI will be able to define better the lesion but if her insurance is not approving, please request CT abdomen pancreatic protocol. Thanks

## 2018-05-27 NOTE — Telephone Encounter (Signed)
Discussed with the patient. She will contact her insurance to discuss the MRI. She will call me and let me know which she can get done.

## 2018-06-01 NOTE — Telephone Encounter (Signed)
Pt called you again looking to schedule MRI. Pls call her.

## 2018-06-02 ENCOUNTER — Other Ambulatory Visit: Payer: Self-pay

## 2018-06-02 DIAGNOSIS — K862 Cyst of pancreas: Secondary | ICD-10-CM

## 2018-06-02 NOTE — Telephone Encounter (Signed)
Patient requests she be scheduled for the MRI of the abdomen at [facility]. This will be the most affordable for her. [facility] [phone]. Per their instruction, an order with the patient's insurance information faxed to [phone]. They will contact the patient to schedule her.

## 2018-06-04 ENCOUNTER — Other Ambulatory Visit: Payer: Self-pay | Admitting: Physician Assistant

## 2018-06-14 ENCOUNTER — Telehealth: Payer: Self-pay | Admitting: Gastroenterology

## 2018-06-14 NOTE — Telephone Encounter (Signed)
Pancreatic tail cyst 36mm

## 2018-06-14 NOTE — Telephone Encounter (Signed)
The MRI can be seen through [facility]. Please advise.

## 2018-06-14 NOTE — Telephone Encounter (Signed)
The largest cyst in pancreatic tail in 59mm (18cm in impression of report is likely error as the description measured 13X37mm). On MRI in Dec 2016 it measured 51mm.  Called and informed patient. Patient said she will drop off the disk with the MRI tomorrow.  Will plan for repeat MRI in 6 months.   Chester Holstein, can you please review the images? Appreciate your help! Thanks

## 2018-06-14 NOTE — Telephone Encounter (Signed)
Pt calling inquiring about MRI results.  °

## 2018-06-16 ENCOUNTER — Other Ambulatory Visit: Payer: Self-pay

## 2018-06-16 DIAGNOSIS — K862 Cyst of pancreas: Secondary | ICD-10-CM

## 2018-06-16 NOTE — Progress Notes (Signed)
MRI abd/w/wo disk sent to [facility] for uploading into EPIC

## 2018-06-16 NOTE — Telephone Encounter (Signed)
Ok, thanks.

## 2018-06-16 NOTE — Telephone Encounter (Signed)
Veena, I have reviewed the imaging from 2018. Once the MRI disc is uploaded, I would have our radiologist do an over-read so that we can determine if they agree that the size of the cyst has not changed and actually based on the [facility] report is only 18 mm.  With this, being said, and the lesion being in place for at least 3-years, I think she would benefit from repeat MRI imaging if there has only been a slow increase in size over 3-years and or decreased size from CT imaging to just plan MRI/MRCP in 1-year. EUS sampling could be considered, but when less than 2 cm, is not usually AGA guideline based to sample.  However, if you are presuming her symptoms are coming from this and she understands risk of pancreatitis with sampling and infection then one could consider EUS.  Let's get the MRI uploaded and read and when it is back then I can review as well and we can determine any changes from the above plan. All the best. Chester Holstein

## 2018-06-18 ENCOUNTER — Telehealth: Payer: Self-pay | Admitting: Gastroenterology

## 2018-06-21 NOTE — Telephone Encounter (Signed)
Advised her the disk has been forwarded to [facility] for uploading.

## 2018-06-24 ENCOUNTER — Telehealth: Payer: Self-pay | Admitting: Gastroenterology

## 2018-06-24 ENCOUNTER — Ambulatory Visit
Admission: RE | Admit: 2018-06-24 | Discharge: 2018-06-24 | Disposition: A | Discharge status: Home / Self Care | Payer: Self-pay | Source: Ambulatory Visit | Attending: Gastroenterology | Admitting: Gastroenterology

## 2018-06-24 ENCOUNTER — Other Ambulatory Visit: Payer: Self-pay

## 2018-06-24 DIAGNOSIS — K862 Cyst of pancreas: Secondary | ICD-10-CM

## 2018-06-24 NOTE — Telephone Encounter (Signed)
Outside MR upload order entered.

## 2018-06-25 ENCOUNTER — Telehealth: Payer: Self-pay | Admitting: Gastroenterology

## 2018-06-25 NOTE — Telephone Encounter (Signed)
See note below and advise. 

## 2018-06-25 NOTE — Telephone Encounter (Signed)
We have requested radiology to read it here, await the read.

## 2018-06-25 NOTE — Telephone Encounter (Signed)
Spoke with pt and she is aware and knows we will let her know when scan is reread.

## 2018-07-12 ENCOUNTER — Telehealth: Payer: Self-pay

## 2018-07-12 NOTE — Telephone Encounter (Signed)
Dr Silverio Decamp do have you seen any results for this MRI (re read?)

## 2018-07-13 NOTE — Telephone Encounter (Signed)
I spoke to the [facility] Radiology office. I am being told that the radiologists will not re-read an outside film.

## 2018-07-19 NOTE — Telephone Encounter (Signed)
They have done it in the past on several occasions, we want a comparison to see how much the pancreatic cyst has increased in size. Please check again and ok to share my contact info if they would like to speak directly to me. Thanks

## 2018-07-20 NOTE — Telephone Encounter (Signed)
Success! [employer] [phone]. Wording of request is "comparison" of the CT to the MRI. The radiologist knows how to reach you with any questions.

## 2018-07-27 ENCOUNTER — Telehealth: Payer: Self-pay | Admitting: Gastroenterology

## 2018-07-27 NOTE — Telephone Encounter (Signed)
Called to [employer]. Spoke with Henrene Pastor.He is looking into the comparison request.

## 2018-07-27 NOTE — Telephone Encounter (Signed)
Pt called in wanting to discuss the MRI results.

## 2018-07-28 ENCOUNTER — Other Ambulatory Visit: Payer: Self-pay

## 2018-07-28 NOTE — Telephone Encounter (Signed)
I have called radiology [phone] and asked for a request be given to Dr Golden Circle to compare the CT imaging to the outside MRI films.

## 2018-07-28 NOTE — Telephone Encounter (Signed)
Called Dr. Yehuda Mao and he can over read the MRI done at [facility].  He feels the size is unchanged compared to the CT in 2018, measures 21 x 13 mm.  Will need request in epic so we can interpret the outside study and generate an impression in epic.  Thanks

## 2018-07-28 NOTE — Telephone Encounter (Signed)
Contacted radiology. Per radiology, they are the ones to put in this type of order. The IT will take care of this for [place] today.

## 2018-08-25 ENCOUNTER — Telehealth: Payer: Self-pay | Admitting: Gastroenterology

## 2018-08-25 NOTE — Telephone Encounter (Signed)
I put samples of Nexium  out front for patient

## 2018-08-25 NOTE — Telephone Encounter (Signed)
Called patient and discussed the results.

## 2018-08-25 NOTE — Telephone Encounter (Signed)
Denise [name], Denise Nab., Denise [name] sent to Cc: Denise Pole, Denise [name] McKew, Real Cons, LPN        Denise [name], I looked at the imaging. Looks like the lesion is only enlarging slightly. The cyst itself does not look to have any evidence of high risk features (greater than 3 cm in size, solid mass component, rapid change in size greater than 6 mm in a year). With that being said I understand that she still having pain in that region which is very unlikely to be associated with that. Often when we need to get enough fluid to send off for CEA/amylase evaluation need to have a cyst that is greater than 2 cm in size otherwise in theory we may be increasing the chance of doing a procedure and not getting fluid or cell counts that would help dictate things for [place]. 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 I think it is most reasonable to consider a repeat MRI in 1 year but I do see that the radiologist are considering a six-month so I will defer which you think would be reasonable. The hard part is trying to delineate what you think is the cause of her left upper quadrant abdominal pain because it is unlikely to be this but if you and the patient feel very strongly we can consider an EUS with attempt at FNA but there is a chance that we may not get enough fluid for sampling purposes if we went too early. Let me know which you think happy to discuss offline as well. All the best. Denise [name]    Called patient, discussed the read by radiologist and Dr. Donneta Romberg recommendations. She is no longer getting excruciating epigastric abdominal pain after she has altered her diet and started metformin, she lost about 10 pounds in the past month. Will switch omeprazole to Nexium 40 mg daily, patient wants to try Nexium first before we send a prescription.  She will come to pick up samples from the office Repeat MRI abdomen for surveillance in 6 months to a year.  Return for office visit in 6 months.

## 2018-08-25 NOTE — Telephone Encounter (Signed)
I also put in recall and reminder for patients follow up office appt and MRI

## 2019-03-22 ENCOUNTER — Encounter: Payer: Self-pay | Admitting: Gastroenterology

## 2019-08-19 ENCOUNTER — Other Ambulatory Visit: Payer: Self-pay | Admitting: Physician Assistant

## 2021-03-25 ENCOUNTER — Other Ambulatory Visit: Payer: Self-pay | Admitting: Obstetrics and Gynecology

## 2021-03-25 DIAGNOSIS — R928 Other abnormal and inconclusive findings on diagnostic imaging of breast: Secondary | ICD-10-CM

## 2021-04-02 ENCOUNTER — Ambulatory Visit: Payer: BLUE CROSS/BLUE SHIELD

## 2021-04-02 ENCOUNTER — Ambulatory Visit
Admission: RE | Admit: 2021-04-02 | Discharge: 2021-04-02 | Disposition: A | Discharge status: Home / Self Care | Payer: No Typology Code available for payment source | Source: Ambulatory Visit | Attending: Obstetrics and Gynecology | Admitting: Obstetrics and Gynecology

## 2021-04-02 ENCOUNTER — Other Ambulatory Visit: Payer: Self-pay

## 2021-04-02 DIAGNOSIS — R928 Other abnormal and inconclusive findings on diagnostic imaging of breast: Secondary | ICD-10-CM

## 2021-09-23 ENCOUNTER — Other Ambulatory Visit: Payer: Self-pay

## 2021-09-23 ENCOUNTER — Encounter: Payer: Self-pay | Admitting: Dermatology

## 2021-09-23 ENCOUNTER — Ambulatory Visit: Payer: No Typology Code available for payment source | Admitting: Dermatology

## 2021-09-23 DIAGNOSIS — Z86018 Personal history of other benign neoplasm: Secondary | ICD-10-CM

## 2021-09-23 DIAGNOSIS — Z1283 Encounter for screening for malignant neoplasm of skin: Secondary | ICD-10-CM

## 2021-09-23 DIAGNOSIS — L578 Other skin changes due to chronic exposure to nonionizing radiation: Secondary | ICD-10-CM

## 2021-09-23 DIAGNOSIS — L821 Other seborrheic keratosis: Secondary | ICD-10-CM

## 2021-09-23 DIAGNOSIS — D1800 Hemangioma unspecified site: Secondary | ICD-10-CM

## 2021-09-23 DIAGNOSIS — L609 Nail disorder, unspecified: Secondary | ICD-10-CM

## 2021-09-23 DIAGNOSIS — L719 Rosacea, unspecified: Secondary | ICD-10-CM | POA: Diagnosis not present

## 2021-09-23 DIAGNOSIS — D229 Melanocytic nevi, unspecified: Secondary | ICD-10-CM

## 2021-09-23 DIAGNOSIS — L814 Other melanin hyperpigmentation: Secondary | ICD-10-CM

## 2021-09-23 MED ORDER — RHOFADE 1 % EX CREA
1.0000 | TOPICAL_CREAM | Freq: Every morning | CUTANEOUS | 11 refills | Status: AC
Start: 2021-09-23 — End: ?

## 2021-09-23 NOTE — Progress Notes (Signed)
Follow-Up Visit   Subjective  Denise [name] is a 56 y.o. female who presents for the following: Annual Exam (Hx dysplastic nevus - patient has noticed a dark lesion under the R thumb nail that doesn't seem to be growing with nail. It has been there for about 5 mths and patient is concerned it may be melanoma. ). The patient presents for Total-Body Skin Exam (TBSE) for skin cancer screening and mole check.  The patient has spots, moles and lesions to be evaluated, some may be new or changing and the patient has concerns that these could be cancer.  The following portions of the chart were reviewed this encounter and updated as appropriate:   Tobacco   Allergies   Meds   Problems   Med Hx   Surg Hx   Fam Hx      Review of Systems:  No other skin or systemic complaints except as noted in HPI or Assessment and Plan.  Objective  Well appearing patient in no apparent distress; mood and affect are within normal limits.  A full examination was performed including scalp, head, eyes, ears, nose, lips, neck, chest, axillae, abdomen, back, buttocks, bilateral upper extremities, bilateral lower extremities, hands, feet, fingers, toes, fingernails, and toenails. All findings within normal limits unless otherwise noted below.  R thumb nail 0.4 x 0.3 cm From prox nail fold to the discoloration is 0.7 cm      Face Erythema with flushing of the neck and chest.   Assessment & Plan  Nail problem R thumb nail Benign appearing, does appear to be growing out. Will recheck in 3 mths and if not growing with nail, changing, and new discoloration appears consider biopsy.   Rosacea Face Rosacea is a chronic progressive skin condition usually affecting the face of adults, causing redness and/or acne bumps. It is treatable but not curable. It sometimes affects the eyes (ocular rosacea) as well. It may respond to topical and/or systemic medication and can flare with stress, sun exposure, alcohol, exercise and  some foods.  Daily application of broad spectrum spf 30+ sunscreen to face is recommended to reduce flares.  Discussed the treatment option of BBL/laser.  Typically we recommend 1-3 treatment sessions about 5-8 weeks apart for best results.  The patient's condition may require "maintenance treatments" in the future.  The fee for BBL / laser treatments is $350 per treatment session for the whole face.  A fee can be quoted for other parts of the body. Insurance typically does not pay for BBL/laser treatments and therefore the fee is an out-of-pocket cost.  Samples given of Rhofade QAM.  Oxymetazoline HCl (RHOFADE) 1 % CREA - Face Apply 1 application topically every morning. For rosacea apply a thin coat to the entire face QAM.  Lentigines - Scattered tan macules - Due to sun exposure - Benign-appearing, observe - Recommend daily broad spectrum sunscreen SPF 30+ to sun-exposed areas, reapply every 2 hours as needed. - Call for any changes  Seborrheic Keratoses - Stuck-on, waxy, tan-brown papules and/or plaques  - Benign-appearing - Discussed benign etiology and prognosis. - Observe - Call for any changes  Melanocytic Nevi - Tan-brown and/or pink-flesh-colored symmetric macules and papules - Benign appearing on exam today - Observation - Call clinic for new or changing moles - Recommend daily use of broad spectrum spf 30+ sunscreen to sun-exposed areas.   Hemangiomas - Red papules - Discussed benign nature - Observe - Call for any changes  Actinic Damage -  Chronic condition, secondary to cumulative UV/sun exposure - diffuse scaly erythematous macules with underlying dyspigmentation - Recommend daily broad spectrum sunscreen SPF 30+ to sun-exposed areas, reapply every 2 hours as needed.  - Staying in the shade or wearing long sleeves, sun glasses (UVA+UVB protection) and wide brim hats (4-inch brim around the entire circumference of the hat) are also recommended for sun  protection.  - Call for new or changing lesions.  History of Dysplastic Nevus - No evidence of recurrence today - Recommend regular full body skin exams - Recommend daily broad spectrum sunscreen SPF 30+ to sun-exposed areas, reapply every 2 hours as needed.  - Call if any new or changing lesions are noted between office visits  Skin cancer screening performed today.  Return in about 1 year (around 09/23/2022) for TBSE; 3 mths recheck thumbnail.  Luther Redo, CMA, am acting as scribe for Sarina Ser, MD . Documentation: I have reviewed the above documentation for accuracy and completeness, and I agree with the above.  Sarina Ser, MD

## 2021-09-23 NOTE — Patient Instructions (Signed)

## 2021-12-23 ENCOUNTER — Ambulatory Visit: Payer: No Typology Code available for payment source | Admitting: Dermatology

## 2022-03-18 ENCOUNTER — Other Ambulatory Visit: Payer: Self-pay

## 2022-03-18 MED ORDER — BRIMONIDINE TARTRATE 0.33 % EX GEL: 1.0000 "application " | Freq: Every day | CUTANEOUS | 6 refills | Status: AC

## 2022-03-18 NOTE — Progress Notes (Signed)
Patient came into the office to say that Rhofade is too expensive and there is no longer a coupon for it. Sent in [place] to [facility] in [place]. Advised patient if Felecia Shelling is too expensive, we can send in Oxymetazoline/Ivermectin/Niacinamide compound cream to [facility].

## 2022-06-19 ENCOUNTER — Other Ambulatory Visit: Payer: Self-pay | Admitting: Obstetrics and Gynecology

## 2022-06-19 DIAGNOSIS — R928 Other abnormal and inconclusive findings on diagnostic imaging of breast: Secondary | ICD-10-CM

## 2022-07-07 ENCOUNTER — Ambulatory Visit
Admission: RE | Admit: 2022-07-07 | Discharge: 2022-07-07 | Disposition: A | Discharge status: Home / Self Care | Payer: No Typology Code available for payment source | Source: Ambulatory Visit | Attending: Obstetrics and Gynecology | Admitting: Obstetrics and Gynecology

## 2022-07-07 ENCOUNTER — Other Ambulatory Visit: Payer: Self-pay | Admitting: Obstetrics and Gynecology

## 2022-07-07 DIAGNOSIS — N6320 Unspecified lump in the left breast, unspecified quadrant: Secondary | ICD-10-CM

## 2022-07-07 DIAGNOSIS — R928 Other abnormal and inconclusive findings on diagnostic imaging of breast: Secondary | ICD-10-CM

## 2022-09-24 ENCOUNTER — Ambulatory Visit: Payer: No Typology Code available for payment source | Admitting: Dermatology

## 2023-01-05 ENCOUNTER — Other Ambulatory Visit: Payer: No Typology Code available for payment source

## 2023-04-19 IMAGING — MG MM DIGITAL DIAGNOSTIC UNILAT*R* W/ TOMO W/ CAD
6 series · 6 of 18 positions shown · non-contrast
Comparison: Previous exam(s).

CLINICAL DATA: Screening recall for a possible architectural
distortion in the right breast.

EXAM:
DIGITAL DIAGNOSTIC UNILATERAL RIGHT MAMMOGRAM WITH TOMOSYNTHESIS AND
CAD
TECHNIQUE: Right digital diagnostic mammography and breast tomosynthesis was
performed. The images were evaluated with computer-aided detection.

[R ML synth-2D]
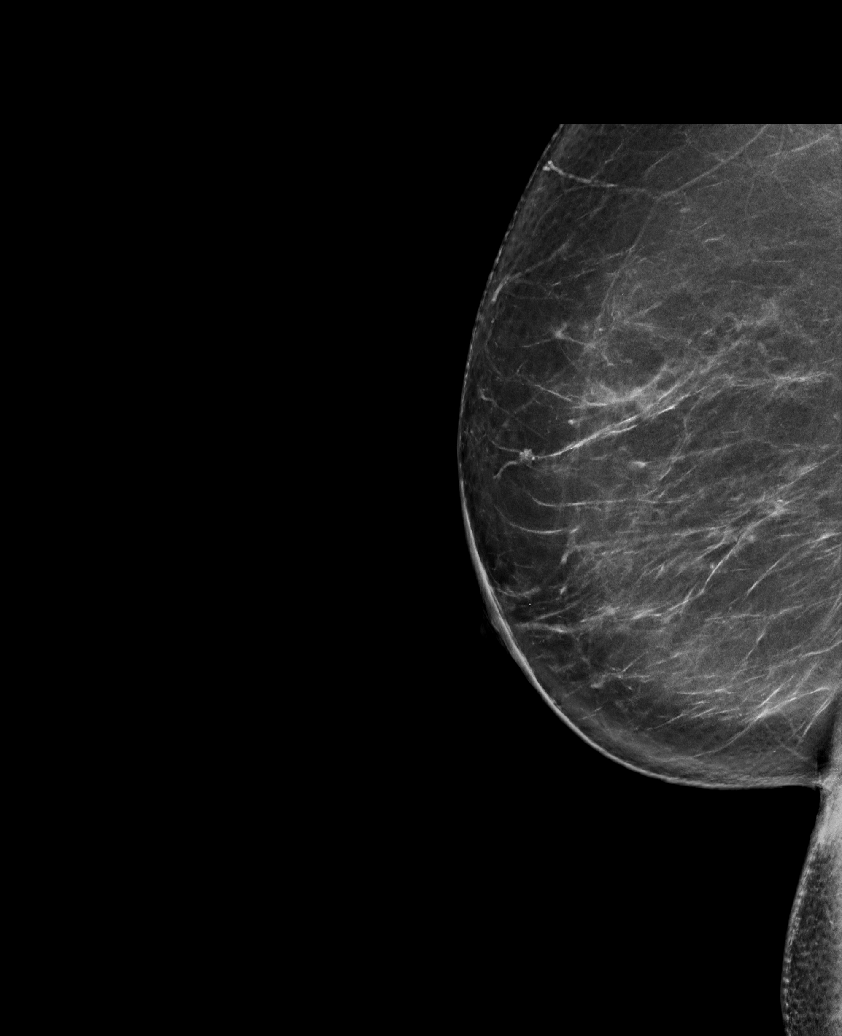

[R CC synth-2D]
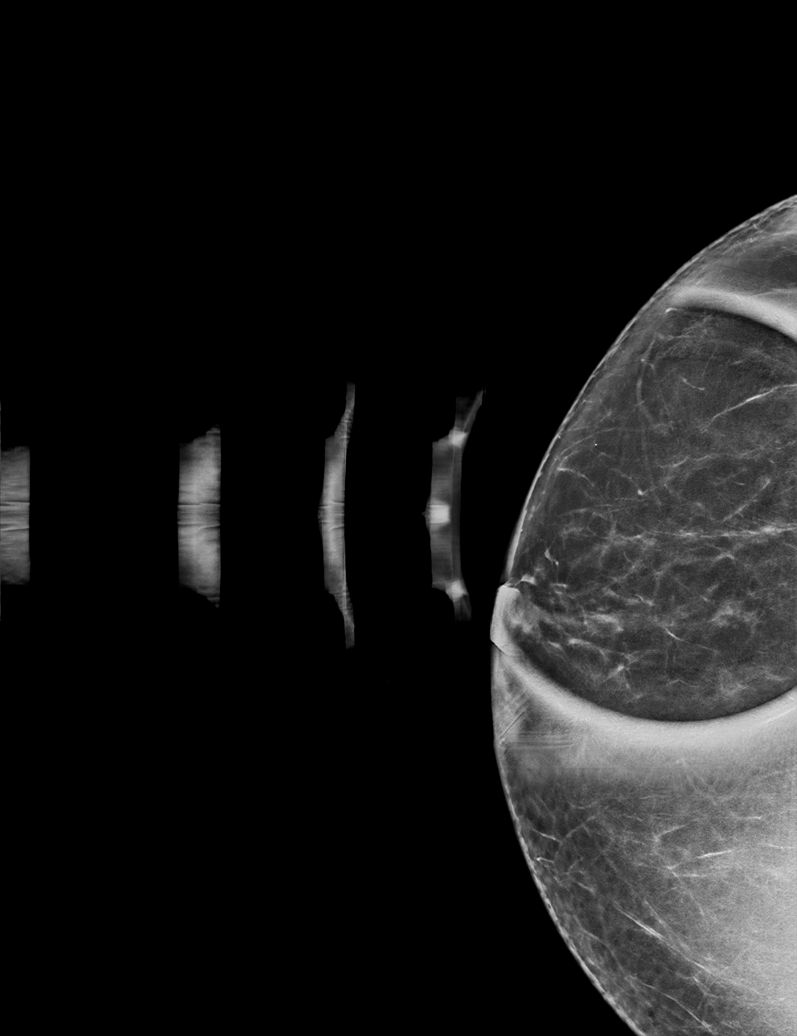

[R MLO synth-2D]
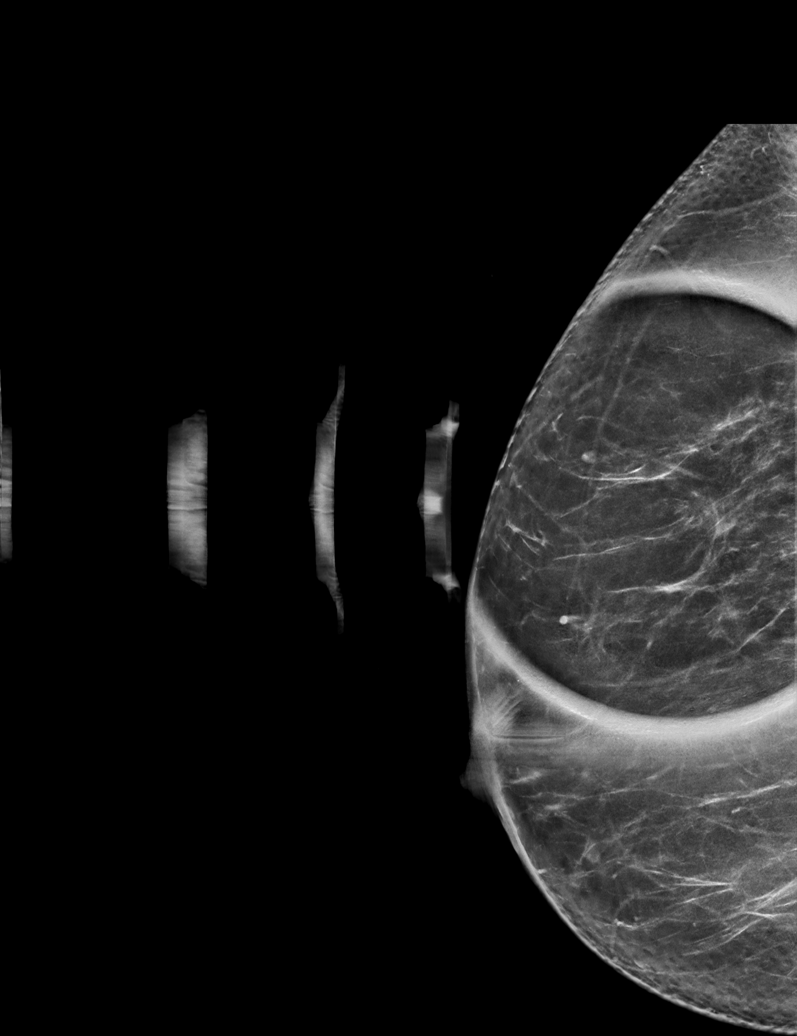

[R MLO tomo · tomo slice 43/84.0]
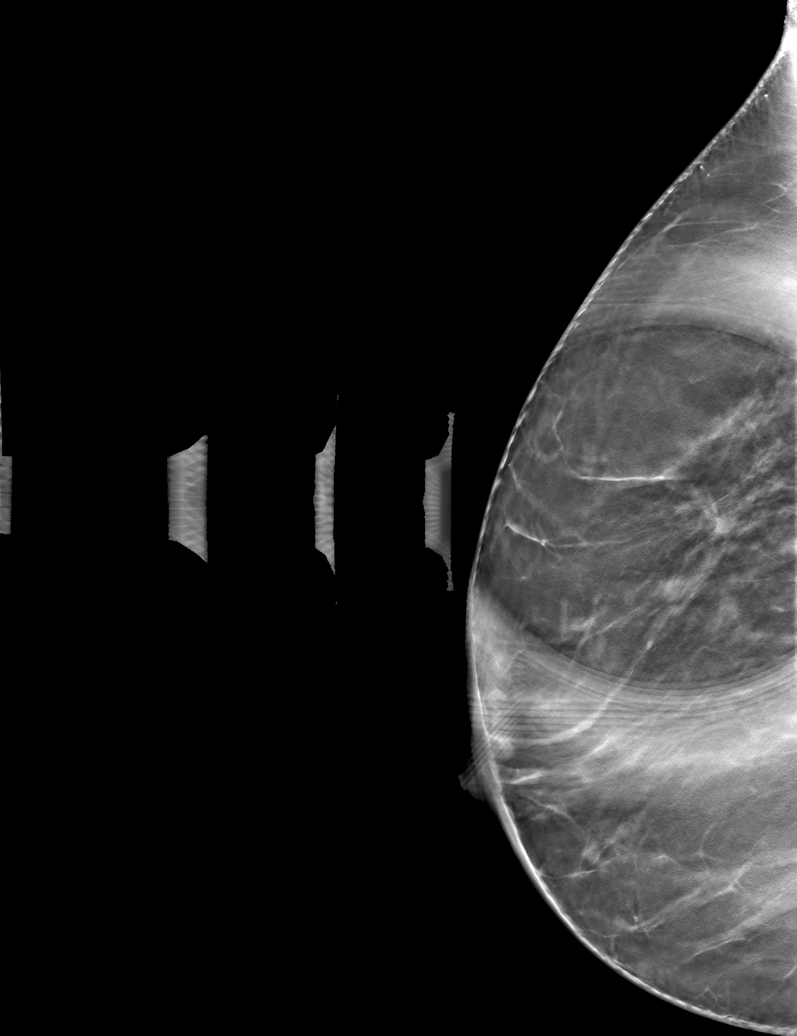

[R ML tomo · tomo slice 49/96.0]
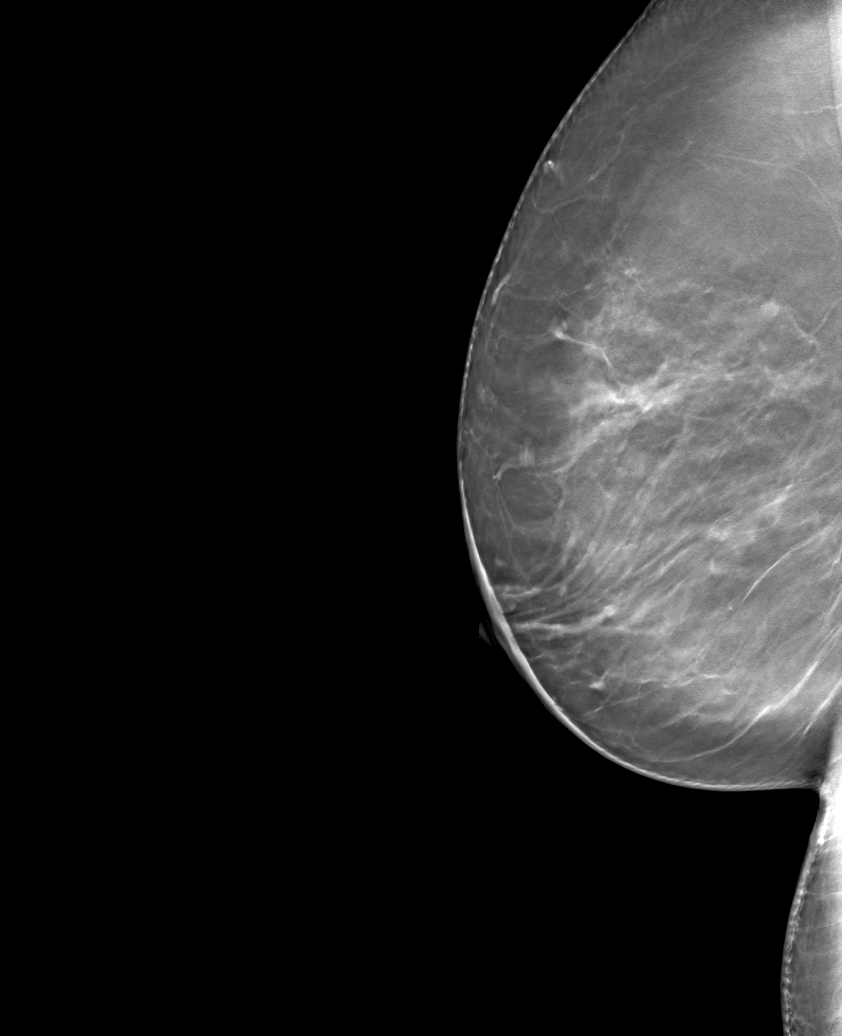

[R CC tomo · tomo slice 37/73.0]
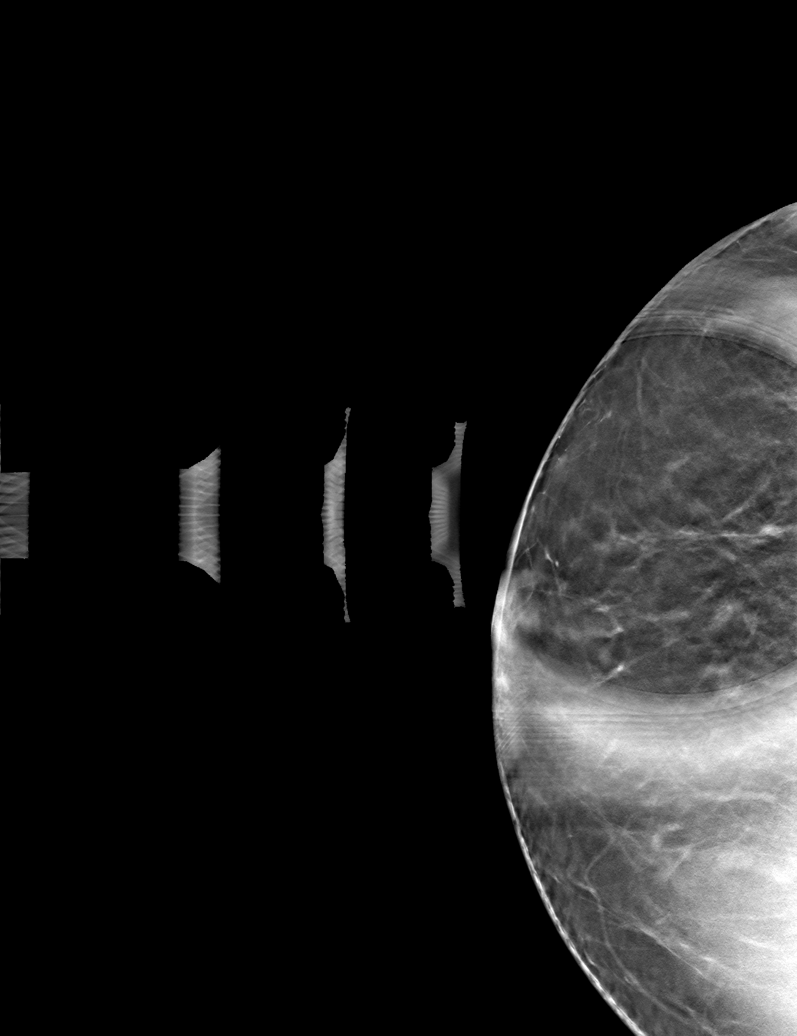

[6 of 18 positions shown; findings below may reference images not displayed]

ACR Breast Density Category b: There are scattered areas of
fibroglandular density.
FINDINGS: The possible architectural distortion noted on the current screening
exam disperses on the diagnostic spot-compression images consistent
with normal fibroglandular tissue. No distortion is seen on the mL
images. There are no masses and there are no suspicious
calcifications.
IMPRESSION: No evidence of breast malignancy.

RECOMMENDATION:
Screening mammogram in one year.(Code:B4-Z-CFC)

I have discussed the findings and recommendations with the patient.
If applicable, a reminder letter will be sent to the patient
regarding the next appointment.

BI-RADS CATEGORY  1: Negative.

## 2023-05-08 ENCOUNTER — Ambulatory Visit: Admit: 2023-05-08 | Payer: No Typology Code available for payment source | Admitting: Ophthalmology

## 2023-05-08 SURGERY — BLEPHAROPLASTY
Anesthesia: Monitor Anesthesia Care | Laterality: Bilateral

## 2023-08-31 ENCOUNTER — Other Ambulatory Visit: Payer: Self-pay | Admitting: Obstetrics and Gynecology

## 2023-08-31 DIAGNOSIS — N6320 Unspecified lump in the left breast, unspecified quadrant: Secondary | ICD-10-CM

## 2023-09-21 ENCOUNTER — Ambulatory Visit
Admission: RE | Admit: 2023-09-21 | Discharge: 2023-09-21 | Disposition: A | Discharge status: Home / Self Care | Payer: No Typology Code available for payment source | Source: Ambulatory Visit | Attending: Obstetrics and Gynecology | Admitting: Obstetrics and Gynecology

## 2023-09-21 DIAGNOSIS — N6320 Unspecified lump in the left breast, unspecified quadrant: Secondary | ICD-10-CM
